# Patient Record
Sex: Female | Born: 1970 | Race: White | Hispanic: No | Marital: Married | Smoking: Never smoker
Health system: Southern US, Community
[De-identification: ages and names within clinical notes are randomized; demographics above are authoritative.]

## PROBLEM LIST (undated history)

## (undated) DIAGNOSIS — O021 Missed abortion: Secondary | ICD-10-CM

## (undated) DIAGNOSIS — F419 Anxiety disorder, unspecified: Secondary | ICD-10-CM

## (undated) HISTORY — PX: OTHER SURGICAL HISTORY: SHX169

## (undated) HISTORY — DX: Anxiety disorder, unspecified: F41.9

## (undated) HISTORY — DX: Missed abortion: O02.1

## (undated) HISTORY — PX: DILATION AND CURETTAGE OF UTERUS: SHX78

## (undated) HISTORY — PX: PELVIC LAPAROSCOPY: SHX162

## (undated) HISTORY — PX: APPENDECTOMY: SHX54

## (undated) HISTORY — PX: TUBAL LIGATION: SHX77

## (undated) HISTORY — PX: ENDOMETRIAL ABLATION: SHX621

---

## 1999-01-31 ENCOUNTER — Other Ambulatory Visit: Admission: RE | Admit: 1999-01-31 | Discharge: 1999-01-31 | Payer: Self-pay | Admitting: Obstetrics and Gynecology

## 1999-04-28 ENCOUNTER — Other Ambulatory Visit: Admission: RE | Admit: 1999-04-28 | Discharge: 1999-04-28 | Payer: Self-pay | Admitting: Gynecology

## 1999-04-28 ENCOUNTER — Encounter (INDEPENDENT_AMBULATORY_CARE_PROVIDER_SITE_OTHER): Payer: Self-pay | Admitting: Specialist

## 1999-04-28 ENCOUNTER — Ambulatory Visit (HOSPITAL_COMMUNITY): Admission: RE | Admit: 1999-04-28 | Discharge: 1999-04-28 | Payer: Self-pay

## 1999-08-19 ENCOUNTER — Other Ambulatory Visit: Admission: RE | Admit: 1999-08-19 | Discharge: 1999-08-19 | Payer: Self-pay | Admitting: Gynecology

## 1999-09-17 ENCOUNTER — Ambulatory Visit (HOSPITAL_COMMUNITY): Admission: RE | Admit: 1999-09-17 | Discharge: 1999-09-17 | Payer: Self-pay | Admitting: Internal Medicine

## 2000-07-22 ENCOUNTER — Other Ambulatory Visit: Admission: RE | Admit: 2000-07-22 | Discharge: 2000-07-22 | Payer: Self-pay | Admitting: Gynecology

## 2001-10-28 ENCOUNTER — Other Ambulatory Visit: Admission: RE | Admit: 2001-10-28 | Discharge: 2001-10-28 | Payer: Self-pay | Admitting: Gynecology

## 2003-04-05 ENCOUNTER — Other Ambulatory Visit: Admission: RE | Admit: 2003-04-05 | Discharge: 2003-04-05 | Payer: Self-pay | Admitting: Gynecology

## 2004-06-03 ENCOUNTER — Other Ambulatory Visit: Admission: RE | Admit: 2004-06-03 | Discharge: 2004-06-03 | Payer: Self-pay | Admitting: Gynecology

## 2005-06-08 ENCOUNTER — Other Ambulatory Visit: Admission: RE | Admit: 2005-06-08 | Discharge: 2005-06-08 | Payer: Self-pay | Admitting: Gynecology

## 2006-06-25 ENCOUNTER — Other Ambulatory Visit: Admission: RE | Admit: 2006-06-25 | Discharge: 2006-06-25 | Payer: Self-pay | Admitting: Gynecology

## 2007-06-27 ENCOUNTER — Other Ambulatory Visit: Admission: RE | Admit: 2007-06-27 | Discharge: 2007-06-27 | Payer: Self-pay | Admitting: Gynecology

## 2007-08-17 ENCOUNTER — Encounter: Payer: Self-pay | Admitting: Gynecology

## 2007-08-17 ENCOUNTER — Ambulatory Visit (HOSPITAL_BASED_OUTPATIENT_CLINIC_OR_DEPARTMENT_OTHER): Admission: RE | Admit: 2007-08-17 | Discharge: 2007-08-17 | Payer: Self-pay | Admitting: Gynecology

## 2008-03-12 ENCOUNTER — Ambulatory Visit: Payer: Self-pay | Admitting: Gynecology

## 2008-06-29 ENCOUNTER — Other Ambulatory Visit: Admission: RE | Admit: 2008-06-29 | Discharge: 2008-06-29 | Payer: Self-pay | Admitting: Gynecology

## 2008-06-29 ENCOUNTER — Ambulatory Visit: Payer: Self-pay | Admitting: Gynecology

## 2008-06-29 ENCOUNTER — Encounter: Payer: Self-pay | Admitting: Gynecology

## 2008-10-25 ENCOUNTER — Ambulatory Visit: Payer: Self-pay | Admitting: Gynecology

## 2008-11-06 ENCOUNTER — Ambulatory Visit: Payer: Self-pay | Admitting: Gynecology

## 2008-11-09 ENCOUNTER — Ambulatory Visit: Payer: Self-pay | Admitting: Gynecology

## 2008-12-03 ENCOUNTER — Ambulatory Visit: Payer: Self-pay | Admitting: Gynecology

## 2008-12-03 ENCOUNTER — Encounter: Payer: Self-pay | Admitting: Gynecology

## 2008-12-03 ENCOUNTER — Ambulatory Visit (HOSPITAL_COMMUNITY): Admission: RE | Admit: 2008-12-03 | Discharge: 2008-12-04 | Payer: Self-pay | Admitting: Gynecology

## 2008-12-03 HISTORY — PX: ABDOMINAL HYSTERECTOMY: SHX81

## 2008-12-11 ENCOUNTER — Ambulatory Visit: Payer: Self-pay | Admitting: Gynecology

## 2008-12-31 ENCOUNTER — Ambulatory Visit: Payer: Self-pay | Admitting: Gynecology

## 2009-01-14 ENCOUNTER — Ambulatory Visit: Payer: Self-pay | Admitting: Gynecology

## 2009-07-22 ENCOUNTER — Ambulatory Visit: Payer: Self-pay | Admitting: Gynecology

## 2009-07-22 ENCOUNTER — Other Ambulatory Visit: Admission: RE | Admit: 2009-07-22 | Discharge: 2009-07-22 | Payer: Self-pay | Admitting: Gynecology

## 2009-10-10 ENCOUNTER — Emergency Department: Payer: Self-pay | Admitting: Emergency Medicine

## 2010-07-13 ENCOUNTER — Encounter: Payer: Self-pay | Admitting: Gynecology

## 2010-07-23 ENCOUNTER — Ambulatory Visit: Admit: 2010-07-23 | Payer: Self-pay | Admitting: Gynecology

## 2010-07-23 ENCOUNTER — Other Ambulatory Visit (HOSPITAL_COMMUNITY)
Admission: RE | Admit: 2010-07-23 | Discharge: 2010-07-23 | Disposition: A | Discharge status: Home / Self Care | Payer: 59 | Source: Ambulatory Visit | Attending: Gynecology | Admitting: Gynecology

## 2010-07-23 ENCOUNTER — Encounter: Payer: 59 | Admitting: Gynecology

## 2010-07-23 ENCOUNTER — Other Ambulatory Visit: Payer: Self-pay | Admitting: Gynecology

## 2010-07-23 DIAGNOSIS — Z01419 Encounter for gynecological examination (general) (routine) without abnormal findings: Secondary | ICD-10-CM

## 2010-07-23 DIAGNOSIS — Z124 Encounter for screening for malignant neoplasm of cervix: Secondary | ICD-10-CM | POA: Insufficient documentation

## 2010-07-31 ENCOUNTER — Ambulatory Visit (HOSPITAL_COMMUNITY)
Admission: RE | Admit: 2010-07-31 | Discharge: 2010-07-31 | Disposition: A | Discharge status: Home / Self Care | Payer: 59 | Source: Ambulatory Visit | Attending: Gynecology | Admitting: Gynecology

## 2010-07-31 ENCOUNTER — Other Ambulatory Visit: Payer: Self-pay | Admitting: Gynecology

## 2010-07-31 DIAGNOSIS — Z801 Family history of malignant neoplasm of trachea, bronchus and lung: Secondary | ICD-10-CM

## 2010-08-01 ENCOUNTER — Other Ambulatory Visit: Payer: 59

## 2010-08-01 DIAGNOSIS — O99814 Abnormal glucose complicating childbirth: Secondary | ICD-10-CM

## 2010-08-01 DIAGNOSIS — Z13220 Encounter for screening for lipoid disorders: Secondary | ICD-10-CM

## 2010-09-29 LAB — HCG, SERUM, QUALITATIVE: Preg, Serum: NEGATIVE

## 2010-09-29 LAB — URINALYSIS, ROUTINE W REFLEX MICROSCOPIC
Hgb urine dipstick: NEGATIVE
Ketones, ur: NEGATIVE mg/dL
Nitrite: NEGATIVE
pH: 5.5 (ref 5.0–8.0)

## 2010-09-29 LAB — CBC
HCT: 46.9 % — ABNORMAL HIGH (ref 36.0–46.0)
MCV: 94 fL (ref 78.0–100.0)
Platelets: 196 10*3/uL (ref 150–400)
WBC: 7.4 10*3/uL (ref 4.0–10.5)
WBC: 8.2 10*3/uL (ref 4.0–10.5)

## 2010-11-04 NOTE — Op Note (Signed)
NAMELAMONICA, TRUEBA           ACCOUNT NO.:  [id number]   MEDICAL RECORD NO.:  [id number]          PATIENT TYPE:  OIB   LOCATION:  9320                          FACILITY:  [facility]   PHYSICIAN:  Juan H. Lily Peer, M.D.DATE OF BIRTH:  February 28, 1971   DATE OF PROCEDURE:  12/03/2008  DATE OF DISCHARGE:  12/04/2008                               OPERATIVE REPORT   SURGEON:  Lars Mage H. Lily Peer, MD   FIRST ASSISTANT:  Timothy P. Fontaine, MD   INDICATIONS FOR OPERATION:  A 40 year old gravida 3, para 1, AB 2 with  chronic pelvic pain, menometrorrhagia, and stress urinary incontinence.   PREOPERATIVE DIAGNOSES:  1. Dysmenorrhea.  2. Menorrhagia.  3. Pelvic pain.  4. Stress urinary incontinence.   POSTOPERATIVE DIAGNOSIS:  1. Dysmenorrhea.  2. Menorrhagia.  3. Pelvic pain.  4. Stress urinary incontinence.   ANESTHESIA:  General endotracheal anesthesia.   PROCEDURES PERFORMED:  1. Total laparoscopic hysterectomy.  2. Suburethral sling (Solyx single-incision sling).  3. Cystoscopy.   FINDINGS:  Retroverted leiomyomatous uteri with normal-appearing  ovaries, evidence of previous tubal sterilization was present.  No other  abnormality in the pelvis was noted.   DESCRIPTION OF OPERATION:  After the patient was adequately counseled,  she was taken to the operating room where she underwent successful  general endotracheal anesthesia.  She had received a gram of Cefotan for  prophylaxis as well as PSA stockings for DVT prophylaxis.  After the  abdomen was prepped and draped in the usual sterile fashion, a Foley  catheter had been inserted and also the uterus had been sounded to  approximately 10 cm and a RUMI uterine manipulator was placed with the  appropriate tip size and the KOH colpotomizer attached and the uterine  tip was inflated to maintain traction and maneuverability during the  laparoscopic hysterectomy.  After this was then placed and the drapes  were in place, a small  vertical incision was made underneath the  umbilicus and with the OptiVu 10-mm trocar inserted to the peritoneal  cavity.  Once the peritoneum was entered, the pneumoperitoneum was  established approximately 3 L of carbon dioxide.  A 10-mm port was  placed in the right lower abdomen and 5-mm port in the left lower  abdomen under laparoscopic guidance.  Both ovaries were inspected,  appeared normal.  Tubes with evidence of previous tubal sterilization  procedure.  There was no endometriosis or adhesions.  Anterior and  posterior cul-de-sac were free.  There was some subserosal myomas that  were noted.  With the [facility] electrical surgical generator, the  patient's right utero-ovarian ligament was coapted and transected as was  the right fallopian tube and the right round ligament.  The broad and  cardinal ligaments were serially coapted and transected with the  Harmonic scalpel, and the anterior bladder flap was established.  Similar procedure was carried out on the contralateral side with the  colpotomizer placed in the tension portion of the uterus.  The back  blade of the Harmonic scalpel was used to incise the vaginal fornix  anteriorly and posteriorly, and both uterine arteries were coapted and  transected  and the uterus and cervix were pulled out of the vagina.  Extracorporeal closure of the vaginal cuff was accomplished with 0 PDS  on a CT1 needle.  The pelvic cavity was copiously irrigated with normal  saline solution.  The pneumoperitoneum was removed and the 5- and 10-mm  sheaths were removed as well as 10-mm port sheath and laparoscope.  The  subfascial incision of this umbilicus and the 10-mm port were closed  with a running stitch of 0 Vicryl suture and the skin was reapproximated  with Dermabond glue and the umbilicus and the lower abdominal incisions  were closed with a running stitch of 3-0 Rapide suture.  Marcaine 0.25%  was infiltrated in all 3 port site postoperative  analgesia.  Attention  was then placed to the second portion of the operation.  The legs were  placed in a high lithotomy position.  Foley catheter was still in place.  The [place] retractor was placed for exposure.  The urethra was  identified and approximately 1 cm from the urethra in the midline, an  Allis clamp was placed and another one directly underneath it about a  centimeter and half below it.  Lidocaine 1% with 1:100,000 epinephrine  was infiltrated into the submucosa at the midline and in the direction  at 45 degrees where the sling would be placed for a total of  approximately 7 mL.  Metzenbaum scissors were used to dissect underneath  the vaginal mucosa at a 45-degree angle until the pubic rami and  obturator were palpated.  The Solyx device was then inserted with the  tape attached.  The sling had been marked in the midportion and with the  index finger inside the vagina to peel the vaginal sulcus and providing  direction, pressure was then inserted with a surgeon's thumb into the  obturator fossa.  Two clips of the Solyx device released the mesh on one  side and similar procedure was carried out on the contralateral side,  but before the device was released, the patient received indigo carmine  and cystoscopy was performed.  Both ureters were identified and the  bladder mucosa appeared to be intact, and then finally 240 mL of saline  was infiltrated into the bladder and the Crede maneuver was performed by  placing pressure over the mons pubis to see if there was any leakage or  whether the sling needed to be adjusted and it did not, so the Foley  catheter was reinserted and the device was released from the  contralateral side.  The submucosa was reapproximated with a running  stitch of 2-0 Vicryl suture.  The vagina was then packed and the patient  was extubated and transferred to recovery room with stable vital signs.  Blood loss from procedure was 200 mL.  IV fluids 2100  mL of lactated  Ringer.  Urine output 500 mL.      Juan H. Lily Peer, M.D.  Electronically Signed     JHF/MEDQ  D:  12/04/2008  T:  12/04/2008  Job:  [id number]

## 2010-11-04 NOTE — H&P (Signed)
[name], [name]           ACCOUNT NO.:  [id number]   MEDICAL RECORD NO.:  [id number]          PATIENT TYPE:  AMB   LOCATION:  SDC                           FACILITY:  [facility]   PHYSICIAN:  Juan H. Lily Peer, M.D.DATE OF BIRTH:  06/21/1971   DATE OF ADMISSION:  DATE OF DISCHARGE:                              HISTORY & PHYSICAL   The patient is scheduled for surgery, Monday June 14 at 1:00 p.m. at  [facility].  Please have history and physical available.   CHIEF COMPLAINT:  1. Menometrorrhagia.  2. Pelvic pain.  3. Stress urinary incontinence.   HISTORY:  The patient is a 40 year old, gravida 3, para 1, and AB 2, who  is scheduled to undergo a total laparoscopic-assisted vaginal  hysterectomy with suburethral sling (Solex single sling incision) as a  result of the patient's menometrorrhagia and stress urinary  incontinence.  The patient was seen for preoperative consultation on May  21 and to evaluate her recent urodynamic evaluation due to the fact that  she had complained of stress incontinence with coughing and sneezing.  The patient has had history of endometrial ablation in the past as well  as 2 laparoscopies, one for pelvic pain and the other one for  laparoscopic tubal ligation.  Her urodynamic evaluation had been  performed on May 18.  Complex uroflowmetry was normal and demonstrated  no evidence of obstructive uropathy.  Her post residual volume was 30 mL  which was normal.  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  The patient's  cystometric bladder capacity was limited.  The patient's sensory volumes  were lower than expected.  Maximum urethral closure pressures were  within normal range and her III channel CMG with flow was within normal  limits given the testing  situation, so based on the patient's urodynamic  findings contributing factor to the stress urinary incontinence  attributing to a low leak point pressure with little hypermobility of  the urethra and minimal cystocele, and will benefit from a suburethral  sling procedure at the time of a total laparoscopic hysterectomy.  Of  note, her Q-tip test was less than 30 degrees when it was done in the  office.  Also as part of her evaluation for menometrorrhagia and pelvic  pain, the patient had an endometrial biopsy on May of this year and a  weak proliferative endometrium was noted.  She also had an ultrasound on  May 6 at the same time which demonstrated she had 2 thin wall echo-free  cyst 26 x 25 mm and 12 x 10 cm with diffuse internal low-level echoes  and a complex thick wall cyst measuring 19 x 18 mm negative color-flow.  The left ovary had several follicles and there was some blood in the cul-  de-sac consistent with a ruptured cyst.   The patient's past medical history, she is allergic to AMPICILLIN.  She  has had 1 normal spontaneous vaginal delivery, 2 spontaneous ABs.  She  has suffered in the past for anxiety.   She has been taking calcium with vitamin D daily and Xanax 0.5 mg p.r.n.   Her surgeries have consisted of 2 D and Cs, 1 laparoscopy, appendectomy,  laparoscopic tubal sterilization procedure, and endometrial ablation.   Family history is significant for mother and grandfather with diabetes,  and an aunt with breast cancer.   On physical examination,   DICTATION ENDED AT THIS POINT.      Juan H. Lily Peer, M.D.  Electronically Signed     JHF/MEDQ  D:  11/30/2008  T:  12/01/2008  Job:  [id number]

## 2010-11-04 NOTE — H&P (Signed)
[name], [name]           ACCOUNT NO.:  [id number]   MEDICAL RECORD NO.:  [id number]          PATIENT TYPE:  AMB   LOCATION:  SDC                           [facility]ACILITY:  [facility]   PHYSICIAN:  Juan H. Lily Peer, M.D.DATE OF BIRTH:  [birth year]   DATE OF ADMISSION:  DATE OF DISCHARGE:                              HISTORY & PHYSICAL   CHIEF COMPLAINT:  1. Pelvic pain.  2. Menometrorrhagia.  3. Stress urinary incontinence.   HISTORY:  The patient is a 40 year old gravida 3, para 1, AB 2, who is  scheduled to undergo a total laparoscopic hysterectomy along with  suburethral sling procedure on Monday, December 03, 2008, at 1 p.m.  secondary to menorrhagia, pelvic pain, and stress urinary incontinence.  Her workup has consisted of a benign endometrial biopsy demonstrating a  weakly proliferative endometrium.  She has had an ultrasound which  demonstrated uterus measured 8.2 x 6.2 x 5.2 cm retroverted.  She had 2  thin-walled echo-free cysts, 26 mm x 25 mm, and a second one 12 x 10 mm,  and on the left, had a small complex cyst measured 19 x 80 mm, negative  color flow, and some fluid in the cul-de-sac consistent with a possible  ruptured cyst.  The patient's most recent Pap smear was in January this  year which was normal as well.  Because of her stress incontinence when  she coughs and sneeze, she was evaluated, her Q-tip test was less than  30 degrees.  She has a very mild cystocele.  Urodynamic studies done on  Nov 06, 2008, demonstrated complex uroflow with normal and demonstrated  no evidence of obstructive uropathy.  Her postop residuals 30 mL.  Valsalva leak point pressure consistent with genuine stress incontinence  due intrinsic sphincter deficiency.  The patient cystometric bladder  capacity was limited less than 400 mL.  Sensory volumes were more than  expected, maximum urethral closure pressure was greater than 30-cm  within normal range.  Her 3-channel CMG with flow was within  normal  limits given the testing situation.  Based on this findings, the patient  would benefit from a suburethral sling and we are planning on putting  Solyx single sling.   PAST MEDICAL HISTORY:  She has had 2 D&Cs in the past.   ALLERGIES:  Allergic to AMPICILLIN.   OB HISTORY:  She has had 1 normal spontaneous vaginal delivery, 2  spontaneous AB.  She has suffered from anxiety in the past.   She takes calcium, vitamin D daily, Xanax 0.5 mg p.r.n.   PAST SURGICAL HISTORY:  Surgeries have consisted of 2  D and Cs, 1  laparoscopy, 1 appendectomy, laparoscopic tubal and sterilization  procedure and an endometrial ablation as well.   FAMILY HISTORY:  Mother and grandfather with diabetes and with history  of breast cancer.   PHYSICAL EXAMINATION:  VITAL SIGNS:  The patient weighs 123 pounds.  She  is 5 feet tall.  Blood pressure 122/78.  HEENT:  Unremarkable.  NECK:  Supple.  Trachea midline.  No carotid bruits.  No thyromegaly.  LUNGS:  Clear to auscultation without any  rhonchi or wheezes.  HEART:  Regular rate and rhythm.  No murmurs or gallops.  ABDOMEN:  Soft and nontender.  PELVIC:  Bartholin, urethra, Skene within normal limits as described  above.  A very minimal of any first-degree cystocele, Q-tip angle  changed, and Valsalva was less than 30 degrees.  Uterus, anteverted,  normal size, shape, consistency.  Adnexa without any palpable mass or  tenderness.   ASSESSMENT:  1. Pelvic pain, rule out endometriosis.  2. Menometrorrhagia.  3. Stress urinary incontinence.   The patient was counseled as to the total laparoscopic hysterectomy and  suburethral sling procedure.  The risks discussed with the patient at  length are as follows, the risk of infection although she will receive  prophylaxis antibiotic, and are fully aware that she is allergic to  AMPICILLIN.  The risk of deep venous thrombosis and subsequent pulmonary  embolism were discussed.  She will have PSA  stockings for prevention,  also the event of any technical difficulty or internal hemorrhage or  unable to complete the operation laparoscopically, an open abdominal  incision may need to be performed and the patient would need to be  hospitalized.  Also, the risk for additional days, also the risk of  trauma to internal organs such as bladder and  intestines were  discussed.  If she were to need blood or blood products, she is fully  aware of potential risk of anaphylactic reaction, hepatitis, and AIDS.  From the standpoint of the sling procedure for stress urinary  incontinence, we discussed the following risk of injury to the bladder,  her nerves, pain, discomfort in her legs, dyspareunia, erosion, and  urinary retention.  We will give the patient's instructional self-  catheterization while she is in the hospital in the event that she may  have any difficulty voiding.  All these issues were discussed with the  patient in detail and literature information was provided.  The patient  fully agrees and accepts all the above risks and will follow  accordingly.   PLAN:  The patient is scheduled for total laparoscopic hysterectomy with  suburethral sling procedure on Monday, December 03, 2008, 1 p.m. at  [facility].  Please have history and physical available.      Juan H. Lily Peer, M.D.  Electronically Signed     JHF/MEDQ  D:  11/30/2008  T:  12/01/2008  Job:  [id number]

## 2010-11-04 NOTE — H&P (Signed)
[name], [name]           ACCOUNT NO.:  [id number]   MEDICAL RECORD NO.:  [id number]          PATIENT TYPE:  AMB   LOCATION:  [facility]                         FACILITY:  [facility]   PHYSICIAN:  Juan H. Lily Peer, M.D.DATE OF BIRTH:  05/03/1971   DATE OF ADMISSION:  DATE OF DISCHARGE:                              HISTORY & PHYSICAL   The patient is scheduled for surgery, Wednesday, February 25 at 8:30  a.m. at [facility].  Please have the history and  physical available.   CHIEF COMPLAINT:  1. Elective permanent sterilization.  2. Ovarian cyst.  3. Menorrhagia.   HISTORY:  The patient is a 40 year old gravida 3, para 1, AB1, who was  seen in the office for annual gynecological examination on January 5.  She had been complaining of heavy periods every 20 days.  She stated she  was no longer concerned about getting pregnant in the future.  She  already has one child.  Her husband has multiple medical problems.  The  patient had had an endometrial biopsy on January 12, which demonstrated  benign disorder proliferative endometrium.  She could not tolerate the  IUD in the past and had to be removed after 4 years.  She could not  tolerate the oral contraceptive pills either.  She wanted to proceed  with a laparoscopic tubal ligation and we discussed also concurrently  doing an endometrial ablation.  The ultrasound on January 12,  demonstrated a thin-walled echo free avascular cyst measuring 38 x 31 x  25 mm and a left complex follicle of thin wall at 27 x 23 x 22 mm  layering of debris with a thin septation consistent with corpus luteum  cyst and some positive color flow Doppler to the perimeter of the cyst.  A sonohystogram had been negative.  Today, February 24, as part of a  preoperative consultation, she had a followup ultrasound which  demonstrated a month later that 2 thin-walled, echo-free follicles were  noted on the right and a thin wall complex cyst with  internal low-level  echoes measuring 18 x 17 mm.  Some fluid surrounding the ovary were  noted.  Negative color flow Doppler.  Previous large cyst not seen.  Left ovary, 2 complex follicles, negative color flow Doppler.  The  patient was placed on Prometrium 300 __________ mg one orally daily for  2 weeks prior to this procedure and today she had laminaria placed  intracervically as part of preoperative evaluation and examination.   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  Her only  medication is the Prometrium 200 mg which she has been taking for the  past 2 weeks and she takes calcium one tablet orally daily.  She suffers  at times from anxiety.  She has also has had laparoscopy done in 2001  for dysmenorrhea, dyspareunia, and negative findings at that time.  She  has also had an appendectomy before.   FAMILY HISTORY:  Mother and grandfather with history of diabetes and  aunt with history of breast cancer.  PHYSICAL EXAMINATION:  The patient weighs 117 pounds, 5 feet 1-1/4  inches tall, and blood pressure 108/72.  HEENT:  Unremarkable.  NECK:  Supple.  Trachea midline.  No carotid bruits.  No thyromegaly.  LUNGS:  Clear to auscultation without rhonchi or wheezing.  HEART:  Regular rate and rhythm.  No murmurs or gallops.  BREASTS:  Exam not done.  ABDOMEN:  Soft and nontender without rebound or guarding.  PELVIC:  Bartholin, urethra, and Skene are within normal limits.  Vagina  and cervix, no lesions or discharge.  Uterus is slightly retroverted.  Adnexa:  No palpable masses or tenderness.  RECTAL:  Exam deferred.   ASSESSMENT:  A 40 year old gravida 3, para 1, AB1, with menorrhagia and  pelvic pains at times and history of ovarian cyst and requesting  elective sterilization.  The patient with prior negative sonohystogram  and benign endometrial biopsy had laminaria placed intracervically   today.  The patient will undergo diagnostic laparoscopy with possible  ovarian cystectomy as well as bilateral tubal sterilization procedure  followed by rollerball endometrial ablation.  The risks and benefits and  pros and cons of both procedures were discussed with the patient.  The  patient is fully aware that she will not be able to have any more  children.  She fully accepts.  All questions were answered and will  follow accordingly.   PLAN:  As per assessment above.      Juan H. Lily Peer, M.D.  Electronically Signed     JHF/MEDQ  D:  08/16/2007  T:  08/17/2007  Job:  [id number]

## 2010-11-04 NOTE — Op Note (Signed)
Kayla, Mcdaniel           ACCOUNT NO.:  [id number]   MEDICAL RECORD NO.:  [id number]          PATIENT TYPE:  AMB   LOCATION:  [facility]                         FACILITY:  [facility]   PHYSICIAN:  Juan H. Lily Peer, M.D.DATE OF BIRTH:  Dec 13, 1970   DATE OF PROCEDURE:  08/17/2007  DATE OF DISCHARGE:                               OPERATIVE REPORT   INDICATIONS FOR OPERATION:  A 40 year old gravida 3, para 1, AB 2 with  menorrhagia, history of ovarian cyst, requiring an elective permanent  sterilization.   PREOPERATIVE DIAGNOSES:  1. Elective permanent sterilization.  2. Right ovarian cyst.  3. Menorrhagia.   POSTOPERATIVE DIAGNOSES:  1. Elective permanent sterilization.  2. Right ovarian cyst.  3. Menorrhagia.   ANESTHESIA:  General endotracheal anesthesia.   PROCEDURES PERFORMED:  1. Diagnostic laparoscopy.  2. Bilateral tubal ligation (cauterization with transection).  3. Aspiration of right ovarian cyst.  4. Endometrial ablation, roller bar technique.   FINDINGS:  The laparoscopic portion demonstrated normal-appearing  uterus, normal fallopian tube bilaterally, normal left ovary, right  ovary has small 2 x 3 cm simple cyst.  No other abnormalities were noted  in the pelvis or cul-de-sac.   DESCRIPTION OF OPERATION:  After the patient was adequately counseled,  she was taken to the operating room where she underwent a successful  general endotracheal anesthesia.  She was placed on low lithotomy  position.  The abdomen, vagina, and perineum were prepped and draped in  the usual sterile fashion.  A laminaria that was placed  the night  before was removed from the cervix and a Hulka tenaculum was placed  after bimanual examination demonstrated a slightly retroverted uterus.  After the drapes were in place and Foley catheter was in place, a small  incision was made under the umbilicus with a 10/11 mm trocar Optiview  was utilized into the peritoneal cavity.  The peritoneal  cavity was  insufflated to approximately 3 liters of carbon dioxide.  Two additional  ports remained under laparoscopic guidance.  Two fingerbreadths above  the symphysis pubis at the midline and the other one, right lateral  lower abdomen where two 5 mm trocars were inserted under the  laparoscopic evaluation, done under laparoscopic views.  Thorough  inspection of the pelvis demonstrated normal anterior and posterior cul-  de-sac.  No pelvic adhesions.  No endometriosis.  The left and right  fallopian tubes were normal.  The left ovary was normal.  The right  ovary had a small 2.5 cm clear cyst which was aspirated and the fluids  sent for cytology.  Pelvic washings was also performed.  After this was  completed, the right fallopian tube was placed under tension and with a  bipolar Kleppinger forceps, a 2-cm segment of fallopian tube was  cauterized and a small area was transected.  Similar procedure was  carried out on the other side.  Pictures were taken as well.  The  pneumoperineum was removed after ascertaining  adequate hemostasis.  The  subumbilical fascia was closed with a single figure-of-eight 0-Vicryl  and the subcutaneous tissue was reapproximated with 3-0 Vicryl suture  and Dermabond  glue.  The two 5 mm ports were reapproximated with  Dermabond glue.  Marcaine 0.25% was infiltrated in all 3 incisions sites  for a total of 10 cc for postoperative analgesia.   Attention was placed into the hysteroscopic portion of the procedure.  The legs were then placed in a high lithotomy position and the Hulka  tenaculum was removed.  A single-tooth tenaculum was placed on the  anterior cervical lip.  The Lendell Caprice operative resectoscope with the  roller bar attachment was introduced into the intrauterine cavity.  Sorbitol 3% was to sustain media, and VaporTrode was utilized for  ablation of the endometrium.  My thorough inspection of the uterus  demonstrated normal intrauterine cavity.   The left fallopian tube and  right fallopian tube were identified and no endocervical abnormality was  noted.  The Valleylab electrosurgical generator was set at cutting mode  of 160 and coagulation on 70 in a circumferential fashion starting from  the fundus moving towards the internal cervical os.  The VaporTrode was  utilized to ablate the entire endometrial cavity.  Pre and post pictures  were obtained.  Fluid deficit from the sorbital was 5 cc.  For  postoperative tamponade of the uterus is  there were  small vessels, a  30 cc Foley was placed into the uterus for tamponade which will be  removed on discharge from the outpatient recovery room area.  The  patient did receive 1 g of cefoxitin  preoperatively.  IV fluids 1100 cc  of lactated Ringer's and urine output had been 100 cc and clear.      Juan H. Lily Peer, M.D.  Electronically Signed     JHF/MEDQ  D:  08/17/2007  T:  08/18/2007  Job:  [id number]

## 2010-11-07 NOTE — Op Note (Signed)
[facility]  PatientKANIJAH Mcdaniel                     MRN: [id number] Proc. Date: 04/28/99 Adm. Date:  [id number] Attending:  Tonye Royalty                           Operative Report  PREOPERATIVE DIAGNOSIS:       First trimester inevitable abortion.  POSTOPERATIVE DIAGNOSIS:      First trimester inevitable abortion.  OPERATION:                    Dilatation and evacuation.  SURGEON:                      Juan H. Lily Peer, M.D.  ASSISTANT:  ANESTHESIA:                   MAC - paracervical block with 2% Xylocaine with 1:100,000 epinephrine.  ESTIMATED BLOOD LOSS:  INDICATIONS:                  A 40 year old, gravida 3, para 1, abortus 1, currently six weeks into her pregnancy who has had bleeding on and off for the ast several days.  Ultrasound in the office today demonstrated an empty gestational sac and flattened and on examination there was tissue extruding from the cervical os.  DESCRIPTION OF PROCEDURE:     After the patient was adequately counseled she was taken to the operating room where she underwent a MAC anesthesia without any complications.  The patient did receive 1 gram of Cefotan prophylactically before commencement of the operation.  The uterus was felt to be slightly retroverted approximately 6 to 8 weeks size, no adnexal masses or tenderness.  A red rubber  Roxan Hockey was inserted to evacuate the bladder contents for approximately 50 cc. A Graves speculum was inserted into the vaginal vault.  2% Xylocaine with 1:100,000 epinephrine was infiltrated into the cervical stroma for approximately 10 cc at the 2, 4, 8, and 10 oclock.  The cervix required minimal dilatation and an 8 mm curet was introduced into the intrauterine cavity whereby a minimal amount of products of conception were extruded.  This was interchanged with a blunt curet and additional fragments of retained products of conception were  removed, but again it was small quantities.  It appears that the patient may have passed most of it in the office which was submitted in a separate alloquot and this was noted in the report to he pathologist on this specimen to make him aware that an alloquot was previously submitted from the office.  The patient was given 10 units of Pitocin and 500 cc of Ringers lactate.  The single tooth tenaculum and Graves speculum were removed.  The patient tolerated the procedure well.  Her blood type is O positive.  Total  fluid resuscitation consisted of 800 cc of Ringers lactate.  She was transferred to the recovery room with stable vital signs. DD:  04/28/99 TD:  04/28/99 Job: [id number]

## 2010-11-07 NOTE — Op Note (Signed)
[facility]  Patient:    [name], [name]                  MRN: [id number] Adm. Date:  [id number] Attending:  Tonye Royalty                           Operative Report  SURGEON:                      Gaetano Hawthorne. Lily Peer, M.D.  INDICATION FOR OPERATION:     40 year old gravida 3, para 1, AB 2 with chronic pelvic pain and dyspareunia and postcoital bleeding.  PREOPERATIVE DIAGNOSES:       1. Dysmenorrhea.                               2. Dyspareunia and postcoital bleeding.  POSTOPERATIVE DIAGNOSES:      Same.  ANESTHESIA:                   General endotracheal anesthesia.  PROCEDURE PERFORMED:          Diagnostic laparoscopy.  DESCRIPTION OF OPERATION:     After the patient was adequately counseled she was taken to the operating room where she was placed in the low lithotomy position after general endotracheal anesthesia was on board.  The abdomen and vagina and  perineum were prepped and draped in the usual sterile fashion.  A Red Rubber Roxan Hockey was inserted into the bladder to evacuate it of its contents. Examination under anesthesia demonstrated retroverted uterus.  No palpable adnexal masses.  Hulka tenaculum was placed for manipulation during laparoscopic procedure. After the drapes were in place a small stab incision was made underneath the umbilicus followed by insertion of the Verres needle.  Opening internal abdominal pressure was approximately 3 mmHg.  Approximately 3 L of carbon dioxide was insufflated into the abdominal cavity.  After this the Veress needle was removed and followed by insertion of a 10/11 mm Trocar.  A second puncture site was made 2 cm above the symphysis pubis with a 5 mm Trocar under laparoscopic guidance and in a systemic fashion and the anterior/posterior cul-de-sac were inspected. There was no evidence of endometriosis or pelvic adhesions.  Both tubes and ovary appeared normal in appearance.   The under surface of both ovaries appeared normal as well.  There was no evidence of appendix due to the fact that patient has had an appendectomy in the past.  The liver surface appeared smooth.  Gallbladder was ot seen.  No other gross abnormality was noted.  Approximately 10 cc of peritoneal  fluid was found in the cul-de-sac and was aspirated.  Otherwise, a negative diagnostic laparoscopy.  After this the instruments were removed.  Sponge count and needle count were correct.  The 10/11 mm Trocar site fascia was closed with a running stitch of 3-0 Vicryl suture and the skin was reapproximated with subcuticular stitch of 4-0 plain cat gut suture.  The 5 mm Trocar site skin was  reapproximated with interrupted sutures of 4-0 plain cat gut suture.  For postoperative analgesia .25% Marcaine was infiltrated in both incision sites for a total of 10 cc.  The Hulka tenaculum was removed.  Patient received 30 mg of Toradol IV and was transferred to recovery room with stable vital signs.  Blood  loss was minimal.  Fluid  resuscitation consisted of 1000 cc of lactated Ringers. DD:  09/17/99 TD:  09/17/99 Job: [id number]

## 2011-03-13 LAB — POCT PREGNANCY, URINE
Operator id: 114531
Preg Test, Ur: NEGATIVE

## 2011-05-25 ENCOUNTER — Telehealth: Payer: Self-pay

## 2011-05-25 DIAGNOSIS — F419 Anxiety disorder, unspecified: Secondary | ICD-10-CM

## 2011-05-25 MED ORDER — ALPRAZOLAM 0.25 MG PO TABS: 0.2500 mg | ORAL_TABLET | Freq: Every evening | ORAL | Status: AC | PRN

## 2011-05-25 NOTE — Telephone Encounter (Signed)
PT. C-O INCREASED STRESS & THE STRESS IS CAUSING HEADACHES. YOU HAVE GIVEN HER XANAX IN THE PAST FOR THIS AND SHE IS REQUESTING RX NOW.

## 2011-05-25 NOTE — Telephone Encounter (Signed)
Per JF he said [facility] for the prescription and asked for it to be called in since couldn't be escribed. Rx called in.

## 2011-07-03 ENCOUNTER — Ambulatory Visit (INDEPENDENT_AMBULATORY_CARE_PROVIDER_SITE_OTHER): Payer: 59 | Admitting: Gynecology

## 2011-07-03 ENCOUNTER — Encounter: Payer: Self-pay | Admitting: Gynecology

## 2011-07-03 VITALS — BP 118/70

## 2011-07-03 DIAGNOSIS — Z1211 Encounter for screening for malignant neoplasm of colon: Secondary | ICD-10-CM

## 2011-07-03 DIAGNOSIS — F329 Major depressive disorder, single episode, unspecified: Secondary | ICD-10-CM

## 2011-07-03 DIAGNOSIS — F411 Generalized anxiety disorder: Secondary | ICD-10-CM

## 2011-07-03 DIAGNOSIS — K921 Melena: Secondary | ICD-10-CM

## 2011-07-03 DIAGNOSIS — F419 Anxiety disorder, unspecified: Secondary | ICD-10-CM | POA: Insufficient documentation

## 2011-07-03 DIAGNOSIS — F3289 Other specified depressive episodes: Secondary | ICD-10-CM

## 2011-07-03 MED ORDER — PAROXETINE HCL ER 12.5 MG PO TB24: 12.5000 mg | ORAL_TABLET | ORAL | Status: DC

## 2011-07-03 NOTE — Progress Notes (Signed)
Patient presented to the office today with several complaints one is that for the past month and a half she's been noticing blood per rectum with her she has a bowel movement her not to sometime she describes it as much as equivalent to a menstrual period although she has a history of a total laparoscopic hysterectomy and sling procedure in the past. She denies constipation. No family history of GI disorders reported. She also suffered in time from headaches due to a lot of tension stress. She's going through a difficult time in her family right now her husband is unemployed applying for disability is in the process and back surgery she is the only breadwinner. She suffers at times from insomnia but no change in appetite reported. She feels anxious we'll be able to cope at times becomes teary-eyed and depressed has very little family support with exceptions of her husband and children. 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  Exam: Vital signs stable afebrile HEENT unremarkable Lungs clear to auscultation Heart regular rate and rhythm no murmurs or gallops Abdomen soft nontender no rebound Pelvic: Bartholin urethra Skene was within normal limits Vagina: Vaginal cuff intact no gross lesions on inspection Cervix: Absent prior hysterectomy Rectal: The patient was placed in knee-chest position and endoscopic exam was performed no evidence of internal/external hemorrhoids or fissures noted. Fecal occult blood testing was was done.  Assessment: #1 depression/anxiety #2 hematochezia to be evaluated by gastroenterologist #3 tension headaches  Plan patient will be placed on Paxil CR 25 mg daily risk benefits and pros and cons were discussed. She will discontinue the Xanax. She will take over-the-counter migraine headache medications as needed. We'll make arrangements for her to see the gastroenterologist in consultation for further evaluation. She will be placed on antidepressant medication for 6  months if her symptoms worsen she will contact the office so we can refer her to a therapist. There was no evidence of suicidal deviation today.

## 2011-07-03 NOTE — Patient Instructions (Signed)
Depression  Depression is a strong emotion of feeling unhappy that can last for weeks, months, or even longer. Depression causes problems with the ability to function in life. It upsets your:   Relationships.   Sleep.   Eating habits.   Work habits.  HOME CARE  Take all medicine as told by your doctor.   Talk with a therapist, counselor, or friend.   Eat a healthy diet.   Exercise regularly.   Do not drink alcohol or use drugs.  GET HELP RIGHT AWAY IF: You start to have thoughts about hurting yourself or others. MAKE SURE YOU:  Understand these instructions.   Will watch your condition.   Will get help right away if you are not doing well or get worse.   Anxiety and Panic Attacks Your caregiver has informed you that you are having an anxiety or panic attack. There may be many forms of this. Most of the time these attacks come suddenly and without warning. They come at any time of day, including periods of sleep, and at any time of life. They may be strong and unexplained. Although panic attacks are very scary, they are physically harmless. Sometimes the cause of your anxiety is not known. Anxiety is a protective mechanism of the body in its fight or flight mechanism. Most of these perceived danger situations are actually nonphysical situations (such as anxiety over losing a job). CAUSES  The causes of an anxiety or panic attack are many. Panic attacks may occur in otherwise healthy people given a certain set of circumstances. There may be a genetic cause for panic attacks. Some medications may also have anxiety as a side effect. SYMPTOMS  Some of the most common feelings are:  Intense terror.   Dizziness, feeling faint.   Hot and cold flashes.   Fear of going crazy.   Feelings that nothing is real.   Sweating.   Shaking.   Chest pain or a fast heartbeat (palpitations).   Smothering, choking sensations.   Feelings of impending doom and that death is near.    Tingling of extremities, this may be from over-breathing.   Altered reality (derealization).   Being detached from yourself (depersonalization).  Several symptoms can be present to make up anxiety or panic attacks. DIAGNOSIS  The evaluation by your caregiver will depend on the type of symptoms you are experiencing. The diagnosis of anxiety or panic attack is made when no physical illness can be determined to be a cause of the symptoms. TREATMENT  Treatment to prevent anxiety and panic attacks may include:  Avoidance of circumstances that cause anxiety.   Reassurance and relaxation.   Regular exercise.   Relaxation therapies, such as yoga.   Psychotherapy with a psychiatrist or therapist.   Avoidance of caffeine, alcohol and illegal drugs.   Prescribed medication.  SEEK IMMEDIATE MEDICAL CARE IF:   You experience panic attack symptoms that are different than your usual symptoms.   You have any worsening or concerning symptoms.  Document Released: 06/08/2005 Document Revised: 02/18/2011 Document Reviewed: 10/10/2009 [facility] Patient Information 2012 [place], [place]. Document Released: 07/11/2010 Document Revised: 02/18/2011 Document Reviewed: 07/11/2010 [facility] Patient Information 2012 [place], [place].

## 2011-07-03 NOTE — Progress Notes (Signed)
Addended by: Ok Edwards on: 07/03/2011 06:06 PM   Modules accepted: Orders

## 2011-07-06 ENCOUNTER — Telehealth: Payer: Self-pay | Admitting: *Deleted

## 2011-07-06 DIAGNOSIS — F419 Anxiety disorder, unspecified: Secondary | ICD-10-CM

## 2011-07-06 DIAGNOSIS — F329 Major depressive disorder, single episode, unspecified: Secondary | ICD-10-CM

## 2011-07-06 NOTE — Telephone Encounter (Signed)
Paxil CR 12.51 by mouth daily she can be given IV tablets at a time with 4 refills.

## 2011-07-06 NOTE — Telephone Encounter (Signed)
Paxil CR 12.5 rx was sent in quanity #1 and 5 refills.  Pharmacy wants to know if they can have a 90 day rx on this?

## 2011-07-06 NOTE — Progress Notes (Signed)
Addended by: Ladona Horns E on: 07/06/2011 07:17 AM   Modules accepted: Orders

## 2011-07-07 MED ORDER — PAROXETINE HCL ER 12.5 MG PO TB24: 12.5000 mg | ORAL_TABLET | ORAL | Status: DC

## 2011-07-07 NOTE — Telephone Encounter (Signed)
rx sent

## 2011-09-17 ENCOUNTER — Emergency Department (HOSPITAL_COMMUNITY): Payer: 59

## 2011-09-17 ENCOUNTER — Emergency Department (HOSPITAL_COMMUNITY)
Admission: EM | Admit: 2011-09-17 | Discharge: 2011-09-17 | Disposition: A | Discharge status: Home / Self Care | Payer: 59 | Attending: Emergency Medicine | Admitting: Emergency Medicine

## 2011-09-17 ENCOUNTER — Other Ambulatory Visit: Payer: Self-pay

## 2011-09-17 ENCOUNTER — Encounter (HOSPITAL_COMMUNITY): Payer: Self-pay

## 2011-09-17 DIAGNOSIS — R51 Headache: Secondary | ICD-10-CM | POA: Insufficient documentation

## 2011-09-17 DIAGNOSIS — R0602 Shortness of breath: Secondary | ICD-10-CM | POA: Insufficient documentation

## 2011-09-17 DIAGNOSIS — Z9889 Other specified postprocedural states: Secondary | ICD-10-CM | POA: Insufficient documentation

## 2011-09-17 DIAGNOSIS — Z7982 Long term (current) use of aspirin: Secondary | ICD-10-CM | POA: Insufficient documentation

## 2011-09-17 DIAGNOSIS — R209 Unspecified disturbances of skin sensation: Secondary | ICD-10-CM | POA: Insufficient documentation

## 2011-09-17 DIAGNOSIS — F410 Panic disorder [episodic paroxysmal anxiety] without agoraphobia: Secondary | ICD-10-CM | POA: Insufficient documentation

## 2011-09-17 DIAGNOSIS — R112 Nausea with vomiting, unspecified: Secondary | ICD-10-CM | POA: Insufficient documentation

## 2011-09-17 DIAGNOSIS — F411 Generalized anxiety disorder: Secondary | ICD-10-CM | POA: Insufficient documentation

## 2011-09-17 DIAGNOSIS — R079 Chest pain, unspecified: Secondary | ICD-10-CM | POA: Insufficient documentation

## 2011-09-17 LAB — CARDIAC PANEL(CRET KIN+CKTOT+MB+TROPI)
CK, MB: 1.6 ng/mL (ref 0.3–4.0)
Total CK: 64 U/L (ref 7–177)

## 2011-09-17 LAB — DIFFERENTIAL
Basophils Absolute: 0 10*3/uL (ref 0.0–0.1)
Eosinophils Absolute: 0.1 10*3/uL (ref 0.0–0.7)
Eosinophils Relative: 2 % (ref 0–5)
Lymphocytes Relative: 13 % (ref 12–46)
Neutrophils Relative %: 81 % — ABNORMAL HIGH (ref 43–77)

## 2011-09-17 LAB — CBC
MCV: 90.9 fL (ref 78.0–100.0)
Platelets: 245 10*3/uL (ref 150–400)
RDW: 12.7 % (ref 11.5–15.5)
WBC: 8 10*3/uL (ref 4.0–10.5)

## 2011-09-17 LAB — BASIC METABOLIC PANEL
Calcium: 9.5 mg/dL (ref 8.4–10.5)
GFR calc non Af Amer: >90 mL/min (ref >90)
Sodium: 138 mEq/L (ref 135–145)

## 2011-09-17 MED ORDER — HYDROCODONE-ACETAMINOPHEN 5-325 MG PO TABS: 1.0000 | ORAL_TABLET | ORAL | Status: AC | PRN

## 2011-09-17 MED ORDER — HYDROCODONE-ACETAMINOPHEN 5-325 MG PO TABS
1.0000 | ORAL_TABLET | Freq: Once | ORAL | Status: AC
  Administered 2011-09-17: 1 via ORAL
  Filled 2011-09-17: qty 1

## 2011-09-17 MED ORDER — LORAZEPAM 1 MG PO TABS: 1.0000 mg | ORAL_TABLET | Freq: Three times a day (TID) | ORAL | Status: AC | PRN

## 2011-09-17 MED ORDER — LORAZEPAM 1 MG PO TABS
1.0000 mg | ORAL_TABLET | Freq: Once | ORAL | Status: AC
  Administered 2011-09-17: 1 mg via ORAL
  Filled 2011-09-17: qty 1

## 2011-09-17 NOTE — Discharge Instructions (Signed)
RESOURCE GUIDE  Dental Problems  Patients with Medicaid: [facility]                     [facility] [address]                                           [address] Phone:  [phone]                                                  Phone:  [phone]  If unable to pay or uninsured, contact:  [facility] or [facility] to become qualified for the adult dental clinic.  Chronic Pain Problems Contact [facility]  [phone] Patients need to be referred by their primary care doctor.  Insufficient Money for Medicine Contact [employer]:  call "211" or [facility] 271-5999.  No Primary Care Doctor Call [facility]  832-8000 Other agencies that provide inexpensive medical care    [facility]  832-8035    [facility]  832-7272    [facility]  [phone]    Women's Clinic  832-4777    [facility]  373-0678    [facility]  272-1050  Psychological Services [facility]  832-9600 [facility]  [phone] [facility]   800 853-5163 (emergency services 641-4993)  Substance Abuse Resources [facility]  336-882-2125 [facility] 336-784-9470 [facility] 336-285-9073 [facility] 336-845-3988 Residential & Outpatient Substance Abuse Program  800-659-3381  Abuse/Neglect [place] Child Abuse Hotline (336) 641-3795 [place] Child Abuse Hotline 800-378-5315 (After Hours)  Emergency Shelter [facility] (336) 271-5985  [facility] (336) 275-9566 [facility] (704) 372-4663  MRSA Hotline #:   832-7006    [place] Resources  [facility] [place]     [employer]                          [facility] [address] [place]                       [address]      [address]  [place]                                                [place]                            [place] Phone:  [phone]                                   Phone:  [phone]                 Phone:  [phone]  [facility] Phone:  342-8316    [place] Child Abuse Hotline (336) 342-1394 (336) 342-3537 (After Hours)  Anxiety and Panic Attacks Your caregiver has informed you that you are having an anxiety or panic attack. There may be many forms of this. Most of the time these attacks come suddenly and without warning. They come at any time of day, including periods of sleep, and at any time of life. They may be strong and unexplained. Although panic attacks are very scary, they are physically harmless. Sometimes the cause of your anxiety is not known. Anxiety is a protective mechanism of the body in its fight or flight mechanism. Most of these perceived danger situations are actually nonphysical situations (such as anxiety over losing a job). CAUSES  The causes of an anxiety or panic attack are many. Panic attacks may occur in otherwise healthy people given a certain set of circumstances. There may be a genetic cause for panic attacks. Some medications may also have anxiety as a side effect. SYMPTOMS  Some of the most common feelings are:  Intense terror.   Dizziness, feeling faint.   Hot and cold flashes.   Fear of going crazy.   Feelings that nothing is real.   Sweating.   Shaking.   Chest pain or a fast heartbeat (palpitations).   Smothering, choking sensations.   Feelings of impending doom and that death is near.   Tingling of extremities, this may be from over-breathing.   Altered reality (derealization).   Being detached from yourself (depersonalization).  Several symptoms can be present to make up anxiety or panic attacks. DIAGNOSIS  The evaluation by your caregiver will depend on the type of symptoms you are  experiencing. The diagnosis of anxiety or panic attack is made when no physical illness can be determined to be a cause of the symptoms. TREATMENT  Treatment to prevent anxiety and panic attacks may include:  Avoidance of circumstances that cause anxiety.   Reassurance and relaxation.   Regular exercise.   Relaxation therapies, such as yoga.   Psychotherapy with a psychiatrist or therapist.   Avoidance of caffeine, alcohol and illegal drugs.   Prescribed medication.  SEEK IMMEDIATE MEDICAL CARE IF:   You experience panic attack symptoms that are different than your usual symptoms.   You have any worsening or concerning symptoms.  Document Released: 06/08/2005 Document Revised: 05/28/2011 Document Reviewed: 10/10/2009 [employer] Patient Information 2012 [employer]. 

## 2011-09-17 NOTE — ED Provider Notes (Signed)
I saw and evaluated the patient, reviewed the resident's note and I agree with the findings and plan.  I saw the patient along with Dr. Manson Passey and agree with his note, assessment, and plan.  The patient presented complaining of headache that started this morning followed by pain in the chest and numbness in hands.  She reports being under a lot of stress at work which is where this occurred.  On exam, she is anxious and tearful and vitals are stable.  The heart and lung exam is unremarkable and the abdomen is benign.  She is neurologically intact and there is no papilledema.  Workup was initiated including CT of the head, ekg, and labs.  These were all unremarkable and the patient was much improved with ativan and time.  I suspect she had some sort of anxiety attack and will be discharged to home.  Geoffery Lyons, MD 09/17/11 [phone]

## 2011-09-17 NOTE — ED Provider Notes (Signed)
History     CSN: [id number]  Arrival date & time 09/17/11  1037   First MD Initiated Contact with Patient 09/17/11 1051      Chief Complaint  Patient presents with  . Headache  . Chest Pain    (Consider location/radiation/quality/duration/timing/severity/associated sxs/prior treatment) HPI The patient is a 41 yo woman, history of anxiety, presenting with chest pain.  Around 8am this morning, the patient developed a severe headache, 10/10 in severity, involving the entire head diffusely.  She subsequently developed symptoms of nausea with 1 episode of vomiting, as well as a sensation of shortness of breath, and chest pain, described as a diffuse chest "heaviness", 10/10 in severity, non-radiating, constant since that time, not worsened by exertion or relieved by rest, not changed by food.  She also notes symptoms of bilateral arm "tingling", without numbness or motor weakness.  The patient called EMS, was given aspirin en route, and was taken to the ED.  The patient notes no history of HTN, HL, or smoking, and no FH of MI.  She notes a history of significant anxiety in the past, previously treated with benzos, but has been without treatment for over 1 year.  She notes no cough, fever, chills, rhinorrhea, neck pain, palpitations, abdominal pain, or diarrhea.  Past Medical History  Diagnosis Date  . NSVD (normal spontaneous vaginal delivery)   . Missed ab     X2  . Anxiety     Past Surgical History  Procedure Date  . Dilation and curettage of uterus     X2  . Appendectomy   . Pelvic laparoscopy   . Tubal ligation     BY LAPAROSCOPY  . Endometrial ablation   . Abdominal hysterectomy 12/03/2008    TLH/SLING SOLYX    Family History  Problem Relation Age of Onset  . Diabetes Mother   . Cancer Mother     LUNG/SMOKER  . Cancer Father     LUNG/SMOKER  . Breast cancer Maternal Aunt   . Diabetes Maternal Grandfather     History  Substance Use Topics  . Smoking status: Never  Smoker   . Smokeless tobacco: Never Used  . Alcohol Use: Yes    OB History    Grav Para Term Preterm Abortions TAB SAB Ect Mult Living   3 1   2  2   1       Review of Systems General: no fevers, chills, changes in weight, changes in appetite Skin: no rash HEENT: no blurry vision, hearing changes, sore throat Pulm: no coughing, wheezing CV: see HPI Abd: no abdominal pain, nausea/vomiting, diarrhea/constipation GU: no dysuria, hematuria, polyuria Ext: no arthralgias, myalgias Neuro: see HPI  Allergies  Ampicillin  Home Medications   Current Outpatient Rx  Name Route Sig Dispense Refill  . ASPIRIN EC 81 MG PO TBEC Oral Take 243 mg by mouth once.     Marland Kitchen CALCIUM CARBONATE 600 MG PO TABS Oral Take 600 mg by mouth daily.     . IBUPROFEN 200 MG PO TABS Oral Take 600 mg by mouth every 6 (six) hours as needed. For pain      BP 107/67  Temp(Src) 98.1 F (36.7 C) (Oral)  Resp 20  Ht 5' (1.524 m)  Wt 115 lb (52.164 kg)  BMI 22.46 kg/m2  SpO2 100%  Physical Exam General: alert, cooperative, appears very anxious HEENT: pupils equal round and reactive to light, vision grossly intact, oropharynx clear and non-erythematous  Neck: supple, no lymphadenopathy, no  JVD Lungs: clear to ascultation bilaterally, mild tachypnea, no wheezes, rales, ronchi Heart: regular rate and rhythm, no murmurs, gallops, or rubs Abdomen: soft, non-tender, non-distended, normal bowel sounds Extremities: no cyanosis, clubbing, or edema Neurologic: alert & oriented X3, cranial nerves II-XII intact, strength 5/5 throughout, sensation intact to light touch throughout, reflexes 2+ throughout   ED Course  Procedures (including critical care time)   Labs Reviewed  CBC  DIFFERENTIAL  BASIC METABOLIC PANEL  CARDIAC PANEL(CRET KIN+CKTOT+MB+TROPI)   No results found.   No diagnosis found.    MDM  The patient is a 41 yo woman, history of anxiety, presenting with "heavy" diffuse chest pain  # Chest  pain - symptoms appear consistent with panic attack.  Less likely ACS, no risk factors, TIMI score = 0. -cxr unremarkable -troponin negative -cbc, bmet reviewed, wnl  # Headache/vomiting - likely related to patient's anxiety, but must rule out increased ICP -CT head - unremarkable, no increased ICP -hydrocodone  # Dispo - discharge home, f/u with PCP   Linward Headland, MD 09/17/11 [phone]

## 2011-09-17 NOTE — ED Notes (Signed)
Pt was brought in by EMS with c/o headache, chest discomfort and vomiting. EMS claimed that she was hyperventilating. Pt also claimed that she has been in a lot stress lately.

## 2011-09-17 NOTE — ED Notes (Signed)
Pt claimed that her headache is better

## 2011-09-17 NOTE — ED Notes (Signed)
Pt crying, skin is warm and dry, respiration is even and unlabored. Will continue to monitor

## 2011-10-15 ENCOUNTER — Ambulatory Visit (INDEPENDENT_AMBULATORY_CARE_PROVIDER_SITE_OTHER): Payer: 59 | Admitting: Gynecology

## 2011-10-15 ENCOUNTER — Encounter: Payer: Self-pay | Admitting: Gynecology

## 2011-10-15 VITALS — BP 120/80 | Ht 60.0 in | Wt 125.0 lb

## 2011-10-15 DIAGNOSIS — F419 Anxiety disorder, unspecified: Secondary | ICD-10-CM

## 2011-10-15 DIAGNOSIS — Z833 Family history of diabetes mellitus: Secondary | ICD-10-CM

## 2011-10-15 DIAGNOSIS — F411 Generalized anxiety disorder: Secondary | ICD-10-CM

## 2011-10-15 DIAGNOSIS — Z01419 Encounter for gynecological examination (general) (routine) without abnormal findings: Secondary | ICD-10-CM

## 2011-10-15 MED ORDER — ALPRAZOLAM 0.25 MG PO TABS: 0.2500 mg | ORAL_TABLET | Freq: Every evening | ORAL | Status: AC | PRN

## 2011-10-15 NOTE — Progress Notes (Signed)
[name] [birth year] [id number]   History:    41 y.o.  for annual exam with complaints of anxiety. Patient had gone to the emergency room several weeks ago as a result of anxiety and panic attack that she had. She's doing with issues at home with her husband who is applying for disability. She is now coping well she does not have any signs of depression when questioned. She has good family support. She had taken Xanax in the past which helped when she took it on a when necessary basis. Patient has a history of a total laparoscopic hysterectomy and suburethral sling (Solex) and has done well since her surgery.  Past medical history,surgical history, family history and social history were all reviewed and documented in the EPIC chart.  Gynecologic History No LMP recorded. Patient has had a hysterectomy. Contraception: none Last Pap: 2012. Results were: normal Last mammogram: No prior study. Results were: No prior study  Obstetric History OB History    Grav Para Term Preterm Abortions TAB SAB Ect Mult Living   3 1  1 2  2   1      # Outc Date GA Lbr Len/2nd Wgt Sex Del Anes PTL Lv   1 PRE     M SVD  Yes Yes   2 SAB            3 SAB                ROS:  Was performed and pertinent positives and negatives are included in the history.  Exam: chaperone present  BP 120/80  Ht 5' (1.524 m)  Wt 125 lb (56.7 kg)  BMI 24.41 kg/m2  Body mass index is 24.41 kg/(m^2).  General appearance : Well developed well nourished female. No acute distress HEENT: Neck supple, trachea midline, no carotid bruits, no thyroidmegaly Lungs: Clear to auscultation, no rhonchi or wheezes, or rib retractions  Heart: Regular rate and rhythm, no murmurs or gallops Breast:Examined in sitting and supine position were symmetrical in appearance, no palpable masses or tenderness,  no skin retraction, no nipple inversion, no nipple discharge, no skin discoloration, no axillary or supraclavicular  lymphadenopathy Abdomen: no palpable masses or tenderness, no rebound or guarding Extremities: no edema or skin discoloration or tenderness  Pelvic:  Bartholin, Urethra, Skene Glands: Within normal limits             Vagina: No gross lesions or discharge  Cervix: Uterus   Adnexa  Without masses or tenderness  Anus and perineum  normal   Rectovaginal  normal sphincter tone without palpated masses or tenderness             Hemoccult not indicated     Assessment/Plan:  41 y.o. female for annual exam with episodes of anxiety. She will be restarted again on Xanax 0.5 mg which she can take 1 daily on a when necessary basis. A requisition was provided for her to schedule her baseline mammogram. She was encouraged to do her monthly self breast examinations. We discussed the new Pap smear screening guidelines and she will not need one any longer since she has had a hysterectomy and has had negative Pap smears in the past. Patient with strong family history diabetes and for this reason we'll do a random blood sugar will also check her CBC screening cholesterol and urinalysis. We'll see her back in one year or when necessary.    Ok Edwards MD, 5:07 PM 10/15/2011

## 2011-10-15 NOTE — Patient Instructions (Signed)
Remember to schedule your mammogram 

## 2011-10-16 LAB — CBC WITH DIFFERENTIAL/PLATELET
Basophils Absolute: 0.1 10*3/uL (ref 0.0–0.1)
Eosinophils Absolute: 0.5 10*3/uL (ref 0.0–0.7)
Eosinophils Relative: 7 % — ABNORMAL HIGH (ref 0–5)
MCH: 29.6 pg (ref 26.0–34.0)
MCHC: 31.5 g/dL (ref 30.0–36.0)
MCV: 94 fL (ref 78.0–100.0)
Platelets: 277 10*3/uL (ref 150–400)
RDW: 13.2 % (ref 11.5–15.5)

## 2011-10-19 LAB — URINALYSIS W MICROSCOPIC + REFLEX CULTURE

## 2011-10-30 ENCOUNTER — Other Ambulatory Visit: Payer: Self-pay | Admitting: *Deleted

## 2011-10-30 DIAGNOSIS — R7989 Other specified abnormal findings of blood chemistry: Secondary | ICD-10-CM

## 2013-01-12 ENCOUNTER — Encounter: Payer: Self-pay | Admitting: Gynecology

## 2013-01-13 ENCOUNTER — Encounter: Payer: Self-pay | Admitting: Gynecology

## 2013-01-13 ENCOUNTER — Ambulatory Visit (INDEPENDENT_AMBULATORY_CARE_PROVIDER_SITE_OTHER): Payer: 59 | Admitting: Gynecology

## 2013-01-13 VITALS — BP 118/70 | Ht 61.0 in | Wt 125.0 lb

## 2013-01-13 DIAGNOSIS — N3281 Overactive bladder: Secondary | ICD-10-CM | POA: Insufficient documentation

## 2013-01-13 DIAGNOSIS — N318 Other neuromuscular dysfunction of bladder: Secondary | ICD-10-CM

## 2013-01-13 DIAGNOSIS — Z01419 Encounter for gynecological examination (general) (routine) without abnormal findings: Secondary | ICD-10-CM

## 2013-01-13 DIAGNOSIS — Z833 Family history of diabetes mellitus: Secondary | ICD-10-CM

## 2013-01-13 MED ORDER — FESOTERODINE FUMARATE ER 4 MG PO TB24: 4.0000 mg | ORAL_TABLET | Freq: Every day | ORAL | Status: DC

## 2013-01-13 NOTE — Patient Instructions (Addendum)
Fesoterodine extended-release tablets  What is this medicine?  FESOTERODINE (fes oh TER oh deen) is used to treat overactive bladder. This medicine reduces the amount of bathroom visits.  This medicine may be used for other purposes; ask your health care provider or pharmacist if you have questions.  What should I tell my health care provider before I take this medicine?  They need to know if you have any of these conditions:  -difficulty passing urine  -glaucoma  -intestinal obstruction  -kidney disease  -liver disease  -an unusual or allergic reaction to fesoterodine, tolterodine, other medicines, foods, dyes, or preservatives  -pregnant or trying to get pregnant  -breast-feeding  How should I use this medicine?  Take this medicine by mouth with a glass of water. Follow the directions on the prescription label. Do not cut, crush or chew this medicine. Take your doses at regular intervals. Do not take your medicine more often than directed.  Talk to your pediatrician regarding the use of this medicine in children. Special care may be needed.  Overdosage: If you think you have taken too much of this medicine contact a poison control center or emergency room at once.  NOTE: This medicine is only for you. Do not share this medicine with others.  What if I miss a dose?  If you miss a dose, take it as soon as you can. If it is almost time for your next dose, take only that dose. Do not take double or extra doses.  What may interact with this medicine?  -antihistamines for allergy, cough and cold  -atropine  -certain medicines for bladder problems like oxybutynin, tolterodine  -certain medicines for Parkinson's disease like benztropine, trihexyphenidyl  -certain medicines for stomach problems like dicyclomine, hyoscyamine  -certain medicines for travel sickness like scopolamine  -clarithromycin  -ipratropium  -itraconazole  -ketoconazole  -rifampin  This list may not describe all possible interactions. Give your health  care provider a list of all the medicines, herbs, non-prescription drugs, or dietary supplements you use. Also tell them if you smoke, drink alcohol, or use illegal drugs. Some items may interact with your medicine.  What should I watch for while using this medicine?  It may take 2 or 3 months to notice the full benefit from this medicine. Your health care professional may also recommend techniques that may help improve control of your bladder and sphincter muscles. These techniques will help you need the bathroom less frequently.  You may need to limit your intake of tea, coffee, caffeinated sodas, and alcohol. These drinks may make your symptoms worse. Keeping healthy bowel habits may lessen bladder symptoms. If you currently smoke, quitting smoking may help reduce irritation to the bladder muscle.  You may get drowsy or dizzy. Do not drive, use machinery, or do anything that needs mental alertness until you know how this drug affects you. Do not stand or sit up quickly, especially if you are an older patient. This reduces the risk of dizzy or fainting spells.  Your mouth may get dry. Chewing sugarless gum or sucking hard candy and drinking plenty of water will help.  This medicine may cause dry eyes and blurred vision. If you wear contact lenses you may feel some discomfort. Lubricating drops may help. See your eye doctor if the problem does not go away or is severe.  What side effects may I notice from receiving this medicine?  Side effects that you should report to your doctor or health care professional as   urine Side effects that usually do not require medical attention (report to your doctor or health care professional if they continue or are  bothersome): -changes in vision -constipation -dizziness -dry eyes or mouth -nausea -stomach upset -tiredness This list may not describe all possible side effects. Call your doctor for medical advice about side effects. You may report side effects to FDA at 1-800-FDA-1088. Where should I keep my medicine? Keep out of the reach of children. Store at room temperature between 15 and 30 degrees C (59 and 86 degrees F). Protect from moisture. Throw away any unused medicine after the expiration date. NOTE: This sheet is a summary. It may not cover all possible information. If you have questions about this medicine, talk to your doctor, pharmacist, or health care provider.  2013, [employer]. (08/07/2009 12:25:12 PM) Overactive Bladder, Adult The bladder has two functions that are totally opposite of the other. One is to relax and stretch out so it can store urine (fills like a balloon), and the other is to contract and squeeze down so that it can empty the urine that it has stored. Proper functioning of the bladder is a complex mixing of these two functions. The filling and emptying of the bladder can be influenced by:  The bladder.  The spinal cord.  The brain.  The nerves going to the bladder.  Other organs that are closely related to the bladder such as prostate in males and the vagina in females. As your bladder fills with urine, nerve signals are sent from the bladder to the brain to tell you that you may need to urinate. Normal urination requires that the bladder squeeze down with sufficient strength to empty the bladder, but this also requires that the bladder squeeze down sufficiently long to finish the job. In addition the sphincter muscles, which normally keep you from leaking urine, must also relax so that the urine can pass. Coordination between the bladder muscle squeezing down and the sphincter muscles relaxing is required to make everything happen normally. With an  overactive bladder sometimes the muscles of the bladder contract unexpectedly and involuntarily and this causes an urgent need to urinate. The normal response is to try to hold urine in by contracting the sphincter muscles. Sometimes the bladder contracts so strongly that the sphincter muscles cannot stop the urine from passing out and incontinence occurs. This kind of incontinence is called urge incontinence. Having an overactive bladder can be embarrassing and awkward. It can keep you from living life the way you want to. Many people think it is just something you have to put up with as you grow older or have certain health conditions. In fact, there are treatments that can help make your life easier and more pleasant. CAUSES  Many things can cause an overactive bladder. Possibilities include:  Urinary tract infection or infection of nearby tissues such as the prostate.  Prostate enlargement.  In women, multiple pregnancies or surgery on the uterus or urethra.  Bladder stones, inflammation or tumors.  Caffeine.  Alcohol.  Medications. For example, diuretics (drugs that help the body get rid of extra fluid) increase urine production. Some other medicines must be taken with lots of fluids.  Muscle or nerve weakness. This might be the result of a spinal cord injury, a stroke, multiple sclerosis or Parkinson's disease.  Diabetes can cause a high urine volume which fills the bladder so quickly that the normal urge to urinate is triggered very strongly. SYMPTOMS   Loss of  bladder control. You feel the need to urinate and cannot make your body wait.  Sudden, strong urges to urinate.  Urinating 8 or more times a day.  Waking up to urinate two or more times a night. DIAGNOSIS  To decide if you have overactive bladder, your healthcare provider will probably:  Ask about symptoms you have noticed.  Ask about your overall health. This will include questions about any medications you are  taking.  Do a physical examination. This will help determine if there are obvious blockages or other problems.  Order some tests. These might include:  A blood test to check for diabetes or other health issues that could be contributing to the problem.  Urine testing. This could measure the flow of urine and the pressure on the bladder.  A test of your neurological system (the brain, spinal cord and nerves). This is the system that senses the need to urinate. Some of these tests are called flow tests, bladder pressure tests and electrical measurements of the sphincter muscle.  A bladder test to check whether it is emptying completely when you urinate.  Cytoscopy. This test uses a thin tube with a tiny camera on it. It offers a look inside your urethra and bladder to see if there are problems.  Imaging tests. You might be given a contrast dye and then asked to urinate. X-rays are taken to see how your bladder is working. TREATMENT  An overactive bladder can be treated in many ways. The treatment will depend on the cause. Whether you have a mild or severe case also makes a difference. Often, treatment can be given in your healthcare provider's office or clinic. Be sure to discuss the different options with your caregiver. They include:  Behavioral treatments. These do not involve medication or surgery:  Bladder training. For this, you would follow a schedule to urinate at regular intervals. This helps you learn to control the urge to urinate. At first, you might be asked to wait a few minutes after feeling the urge. In time, you should be able to schedule bathroom visits an hour or more apart.  Kegel exercises. These exercises strengthen the pelvic floor muscles, which support the bladder. By toning these muscles, they can help control urination, even if the bladder muscles are overactive. A specialist will teach you how to do these exercises correctly. They will require daily  practice.  Weight loss. If you are obese or overweight, losing weight might stop your bladder from being overactive. Talk to your healthcare provider about how many pounds you should lose. Also ask if there is a specific program or method that would work best for you.  Diet change. This might be suggested if constipation is making your overactive bladder worse. Your healthcare provider or a nutritionist can explain ways to change what you eat to ease constipation. Other people might need to take in less caffeine or alcohol. Sometimes drinking fewer fluids is needed, too.  Protection. This is not an actual treatment. But, you could wear special pads to take care of any leakage while you wait for other treatments to take effect. This will help you avoid embarrassment.  Physical treatments.  Electrical stimulation. Electrodes will send gentle pulses to the nerves or muscles that help control the bladder. The goal is to strengthen them. Sometimes this is done with the electrodes outside of the body. Or, they might be placed inside the body (implanted). This treatment can take several months to have an effect.  Medications. These are usually used along with other treatments. Several medicines are available. Some are injected into the muscles involved in urination. Others come in pill form. Medications sometimes prescribed include:  Anticholinergics. These drugs block the signals that the nerves deliver to the bladder. This keeps it from releasing urine at the wrong time. Researchers think the drugs might help in other ways, too.  Imipramine. This is an antidepressant. But, it relaxes bladder muscles.  Botox. This is still experimental. Some people believe that injecting it into the bladder muscles will relax them so they work more normally. It has also been injected into the sphincter muscle when the sphincter muscle does not open properly. This is a temporary fix, however. Also, it might make matters  worse, especially in older people.  Surgery.  A device might be implanted to help manage your nerves. It works on the nerves that signal when you need to urinate.  Surgery is sometimes needed with electrical stimulation. If the electrodes are implanted, this is done through surgery.  Sometimes repairs need to be made through surgery. For example, the size of the bladder can be changed. This is usually done in severe cases only. HOME CARE INSTRUCTIONS   Take any medications your healthcare provider prescribed or suggested. Follow the directions carefully.  Practice any lifestyle changes that are recommended. These might include:  Drinking less fluid or drinking at different times of the day. If you need to urinate often during the night, for example, you may need to stop drinking fluids early in the evening.  Cutting down on caffeine or alcohol. They can both make an overactive bladder worse. Caffeine is found in coffee, tea and sodas.  Doing Kegel exercises to strengthen muscles.  Losing weight, if that is recommended.  Eating a healthy and balanced diet. This will help you avoid constipation.  Keep a journal or a log. You might be asked to record how much you drink and when, and also when you feel the need to urinate.  Learn how to care for implants or other devices, such as pessaries. SEEK MEDICAL CARE IF:   Your overactive bladder gets worse.  You feel increased pain or irritation when you urinate.  You notice blood in your urine.  You have questions about any medications or devices that your healthcare provider recommended.  You notice blood, pus or swelling at the site of any test or treatment procedure.  You have an oral temperature above 102 F (38.9 C). SEEK IMMEDIATE MEDICAL CARE IF:  You have an oral temperature above 102 F (38.9 C), not controlled by medicine. Document Released: 04/04/2009 Document Revised: 08/31/2011 Document Reviewed: 04/04/2009 [facility]  Patient Information 2014 [place], [place].

## 2013-01-13 NOTE — Progress Notes (Signed)
[name] [birth year] [id number]   History:    42 y.o.  for annual gyn exam with complaint over the past few months of urgency incontinence and nocturia. 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 She has done well from her surgery otherwise. Patient denies any prior history of abnormal Pap smear. She had a normal baseline mammogram in 2007. Patient with strong family history of diabetes.  Past medical history,surgical history, family history and social history were all reviewed and documented in the EPIC chart.  Gynecologic History No LMP recorded. Patient has had a hysterectomy. Contraception: status post hysterectomy Last Pap: 2012. Results were: normal Last mammogram: 2007. Results were: normal  Obstetric History OB History   Grav Para Term Preterm Abortions TAB SAB Ect Mult Living   3 1  1 2  2   1      # Outc Date GA Lbr Len/2nd Wgt Sex Del Anes PTL Lv   1 PRE     M SVD  Yes Yes   2 SAB            3 SAB                ROS: A ROS was performed and pertinent positives and negatives are included in the history.  GENERAL: No fevers or chills. HEENT: No change in vision, no earache, sore throat or sinus congestion. NECK: No pain or stiffness. CARDIOVASCULAR: No chest pain or pressure. No palpitations. PULMONARY: No shortness of breath, cough or wheeze. GASTROINTESTINAL: No abdominal pain, nausea, vomiting or diarrhea, melena or bright red blood per rectum. GENITOURINARY:urgency incontinence and nocturia MUSCULOSKELETAL: No joint or muscle pain, no back pain, no recent trauma. DERMATOLOGIC: No rash, no itching, no lesions. ENDOCRINE: No polyuria, polydipsia, no heat or cold intolerance. No recent change in weight. HEMATOLOGICAL: No anemia or easy bruising or bleeding. NEUROLOGIC: No headache, seizures, numbness,  tingling or weakness. PSYCHIATRIC: No depression, no loss of interest in normal activity or change in sleep pattern.     Exam: chaperone present  BP 118/70  Ht 5\' 1"  (1.549 m)  Wt 125 lb (56.7 kg)  BMI 23.63 kg/m2  Body mass index is 23.63 kg/(m^2).  General appearance : Well developed well nourished female. No acute distress HEENT: Neck supple, trachea midline, no carotid bruits, no thyroidmegaly Lungs: Clear to auscultation, no rhonchi or wheezes, or rib retractions  Heart: Regular rate and rhythm, no murmurs or gallops Breast:Examined in sitting and supine position were symmetrical in appearance, no palpable masses or tenderness,  no skin retraction, no nipple inversion, no nipple discharge, no skin discoloration, no axillary or supraclavicular lymphadenopathy Abdomen: no palpable masses or tenderness, no rebound or guarding Extremities: no edema or skin discoloration or tenderness  Pelvic:  Bartholin, Urethra, Skene Glands: Within normal limits             Vagina: No gross lesions or discharge  Cervix: absent  Uterus Absent  Adnexa  Without masses or tenderness  Anus and perineum  normal   Rectovaginal  normal sphincter tone without palpated masses or tenderness             Hemoccult none indicated   Patient was examined in the supine and erect position and on Valsalva she did not leak urine. A sterile Q-tip in pregnant it with 2% lidocaine was placed in the  urethra and on Valsalva maneuver UV angle was less than 30 there was good vaginal support no evidence of cystocele or rectocele or pelvic organ prolapse. No erosion from sling noted  Assessment/Plan:  42 y.o. 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 We discussed with the patient treating this entity with an anticholinergic   agent such as  Toviaz 4 mg daily. Patient denies any history of glaucoma. The risks benefits and pros and cons of the medication were discussed with the  patient. Literature and information on the medication as well as on overactive bladder was provided. The following lab work today: Hemoglobin A1c, CBC, screen cholesterol, TSH, as well as urinalysis. No Pap smear done today the new guidelines discussed.  Ok Edwards MD, 5:07 PM 01/13/2013

## 2013-01-14 LAB — CBC WITH DIFFERENTIAL/PLATELET
Basophils Absolute: 0.1 10*3/uL (ref 0.0–0.1)
Basophils Relative: 1 % (ref 0–1)
Eosinophils Absolute: 0.6 10*3/uL (ref 0.0–0.7)
Lymphs Abs: 2.8 10*3/uL (ref 0.7–4.0)
MCH: 30.5 pg (ref 26.0–34.0)
Neutrophils Relative %: 51 % (ref 43–77)
Platelets: 301 10*3/uL (ref 150–400)
RBC: 4.69 MIL/uL (ref 3.87–5.11)
RDW: 14 % (ref 11.5–15.5)

## 2013-01-14 LAB — URINALYSIS W MICROSCOPIC + REFLEX CULTURE
Bilirubin Urine: NEGATIVE
Glucose, UA: NEGATIVE mg/dL
Leukocytes, UA: NEGATIVE
Protein, ur: NEGATIVE mg/dL
Urobilinogen, UA: 0.2 mg/dL (ref 0.0–1.0)

## 2013-01-14 LAB — CHOLESTEROL, TOTAL: Cholesterol: 205 mg/dL — ABNORMAL HIGH (ref 0–200)

## 2013-01-14 LAB — HEMOGLOBIN A1C: Hgb A1c MFr Bld: 5.2 % (ref <5.7)

## 2013-01-16 ENCOUNTER — Other Ambulatory Visit: Payer: Self-pay | Admitting: Gynecology

## 2013-01-16 DIAGNOSIS — E7800 Pure hypercholesterolemia, unspecified: Secondary | ICD-10-CM

## 2013-01-16 MED ORDER — NITROFURANTOIN MONOHYD MACRO 100 MG PO CAPS: 100.0000 mg | ORAL_CAPSULE | Freq: Two times a day (BID) | ORAL | Status: DC

## 2013-01-16 MED ORDER — FLUCONAZOLE 100 MG PO TABS: 100.0000 mg | ORAL_TABLET | Freq: Every day | ORAL | Status: DC

## 2013-01-17 ENCOUNTER — Encounter: Payer: Self-pay | Admitting: Gynecology

## 2013-01-17 LAB — URINE CULTURE

## 2013-01-23 ENCOUNTER — Encounter: Payer: Self-pay | Admitting: Gynecology

## 2013-05-08 ENCOUNTER — Telehealth: Payer: Self-pay | Admitting: *Deleted

## 2013-05-08 NOTE — Telephone Encounter (Signed)
Please call him prescription for Myrbetriq 25 mg daily. Tell her that if she would like she can come by the office and we can give her some samples to start. Please call in 30 tablets with 11 refills

## 2013-05-08 NOTE — Telephone Encounter (Signed)
Pt was given Rx for Toviaz 4 mg daily on  01/13/13, pharmacy said that provider should consider alternative medication Gelnique, myrbetriq, or vesicare which is covered. Let me know if any of the above would be okay to switch. Please advise

## 2013-05-09 MED ORDER — MIRABEGRON ER 25 MG PO TB24: 25.0000 mg | ORAL_TABLET | Freq: Every day | ORAL | Status: DC

## 2013-05-09 NOTE — Telephone Encounter (Signed)
rx sent to pharmacy, informed pt this has been done.

## 2013-09-23 ENCOUNTER — Emergency Department (HOSPITAL_COMMUNITY): Payer: Managed Care, Other (non HMO)

## 2013-09-23 ENCOUNTER — Encounter (HOSPITAL_COMMUNITY): Payer: Self-pay | Admitting: Emergency Medicine

## 2013-09-23 ENCOUNTER — Emergency Department (HOSPITAL_COMMUNITY)
Admission: EM | Admit: 2013-09-23 | Discharge: 2013-09-24 | Disposition: A | Discharge status: Home / Self Care | Payer: Managed Care, Other (non HMO) | Attending: Emergency Medicine | Admitting: Emergency Medicine

## 2013-09-23 DIAGNOSIS — R079 Chest pain, unspecified: Secondary | ICD-10-CM

## 2013-09-23 DIAGNOSIS — Z9089 Acquired absence of other organs: Secondary | ICD-10-CM | POA: Insufficient documentation

## 2013-09-23 DIAGNOSIS — R112 Nausea with vomiting, unspecified: Secondary | ICD-10-CM | POA: Insufficient documentation

## 2013-09-23 DIAGNOSIS — Z792 Long term (current) use of antibiotics: Secondary | ICD-10-CM | POA: Insufficient documentation

## 2013-09-23 DIAGNOSIS — Z8659 Personal history of other mental and behavioral disorders: Secondary | ICD-10-CM | POA: Insufficient documentation

## 2013-09-23 DIAGNOSIS — R1011 Right upper quadrant pain: Secondary | ICD-10-CM | POA: Insufficient documentation

## 2013-09-23 DIAGNOSIS — R1110 Vomiting, unspecified: Secondary | ICD-10-CM

## 2013-09-23 DIAGNOSIS — R0789 Other chest pain: Secondary | ICD-10-CM | POA: Insufficient documentation

## 2013-09-23 DIAGNOSIS — Z90710 Acquired absence of both cervix and uterus: Secondary | ICD-10-CM | POA: Insufficient documentation

## 2013-09-23 DIAGNOSIS — R109 Unspecified abdominal pain: Secondary | ICD-10-CM

## 2013-09-23 DIAGNOSIS — Z7982 Long term (current) use of aspirin: Secondary | ICD-10-CM | POA: Insufficient documentation

## 2013-09-23 DIAGNOSIS — R6883 Chills (without fever): Secondary | ICD-10-CM | POA: Insufficient documentation

## 2013-09-23 DIAGNOSIS — R1013 Epigastric pain: Secondary | ICD-10-CM | POA: Insufficient documentation

## 2013-09-23 DIAGNOSIS — Z9851 Tubal ligation status: Secondary | ICD-10-CM | POA: Insufficient documentation

## 2013-09-23 DIAGNOSIS — Z79899 Other long term (current) drug therapy: Secondary | ICD-10-CM | POA: Insufficient documentation

## 2013-09-23 DIAGNOSIS — R0602 Shortness of breath: Secondary | ICD-10-CM | POA: Insufficient documentation

## 2013-09-23 LAB — I-STAT TROPONIN, ED: Troponin i, poc: 0 ng/mL (ref 0.00–0.08)

## 2013-09-23 LAB — CBC
HCT: 45.1 % (ref 36.0–46.0)
Hemoglobin: 15.9 g/dL — ABNORMAL HIGH (ref 12.0–15.0)
MCH: 31.5 pg (ref 26.0–34.0)
MCHC: 35.3 g/dL (ref 30.0–36.0)
MCV: 89.3 fL (ref 78.0–100.0)
Platelets: 243 10*3/uL (ref 150–400)
RBC: 5.05 MIL/uL (ref 3.87–5.11)
RDW: 12.8 % (ref 11.5–15.5)
WBC: 6.2 10*3/uL (ref 4.0–10.5)

## 2013-09-23 LAB — BASIC METABOLIC PANEL
BUN: 18 mg/dL (ref 6–23)
CHLORIDE: 103 meq/L (ref 96–112)
CO2: 23 mEq/L (ref 19–32)
CREATININE: 0.85 mg/dL (ref 0.50–1.10)
Calcium: 9.4 mg/dL (ref 8.4–10.5)
GFR calc non Af Amer: 83 mL/min — ABNORMAL LOW (ref >90)
Glucose, Bld: 115 mg/dL — ABNORMAL HIGH (ref 70–99)
POTASSIUM: 4.1 meq/L (ref 3.7–5.3)
Sodium: 140 mEq/L (ref 137–147)

## 2013-09-23 LAB — PRO B NATRIURETIC PEPTIDE: Pro B Natriuretic peptide (BNP): 36.9 pg/mL (ref 0–125)

## 2013-09-23 LAB — HEPATIC FUNCTION PANEL
ALBUMIN: 4 g/dL (ref 3.5–5.2)
ALT: 15 U/L (ref 0–35)
AST: 32 U/L (ref 0–37)
Alkaline Phosphatase: 90 U/L (ref 39–117)
Bilirubin, Direct: <0.2 mg/dL (ref 0.0–0.3)
TOTAL PROTEIN: 7.5 g/dL (ref 6.0–8.3)
Total Bilirubin: 0.6 mg/dL (ref 0.3–1.2)

## 2013-09-23 LAB — LIPASE, BLOOD: Lipase: 34 U/L (ref 11–59)

## 2013-09-23 MED ORDER — SODIUM CHLORIDE 0.9 % IV BOLUS (SEPSIS)
1000.0000 mL | Freq: Once | INTRAVENOUS | Status: AC
  Administered 2013-09-23: 1000 mL via INTRAVENOUS

## 2013-09-23 MED ORDER — NITROGLYCERIN 0.4 MG SL SUBL: 0.4000 mg | SUBLINGUAL_TABLET | SUBLINGUAL | Status: DC | PRN

## 2013-09-23 MED ORDER — GI COCKTAIL ~~LOC~~
30.0000 mL | Freq: Once | ORAL | Status: AC
  Administered 2013-09-23: 30 mL via ORAL
  Filled 2013-09-23: qty 30

## 2013-09-23 MED ORDER — ASPIRIN 81 MG PO CHEW: 324.0000 mg | CHEWABLE_TABLET | Freq: Once | ORAL | Status: DC

## 2013-09-23 MED ORDER — ONDANSETRON HCL 4 MG/2ML IJ SOLN
4.0000 mg | Freq: Once | INTRAMUSCULAR | Status: AC
  Administered 2013-09-23: 4 mg via INTRAVENOUS
  Filled 2013-09-23: qty 2

## 2013-09-23 NOTE — ED Notes (Signed)
Pt. reports mid chest pain onset this evening with SOB and occasional dry cough , also reported nausea and vomitting this afternoon after eating hotdog .

## 2013-09-23 NOTE — ED Notes (Signed)
Pt back from x-ray.

## 2013-09-23 NOTE — ED Notes (Signed)
Dr Elesa MassedWard in room

## 2013-09-23 NOTE — ED Notes (Signed)
Pt taken to xray 

## 2013-09-23 NOTE — ED Provider Notes (Signed)
TIME SEEN: 9:50 PM  CHIEF COMPLAINT: Chest pain, abdominal pain, shortness of breath, nausea and vomiting  HPI: Patient is a 43 year old female with no significant past medical history who presents to the emergency department with central chest pain, epigastric pain and shortness of breath that started after eating a hot dog this afternoon. She states she has had nausea and vomiting since. She states earlier today she was not feeling well. She has had chills. No fevers. Her pain is not exertional or pleuritic. She describes her pain as a pressure. No history of hypertension, diabetes, hyperlipidemia or tobacco abuse. No family history of CAD. No history of PE, DVT, prolonged immobilization, exogenous hormone use, lower extreme swelling or pain, recent fracture or surgery or trauma. Patient does have a history of an appendectomy, bilateral tubal ligation and hysterectomy.  ROS: See HPI Constitutional: no fever  Eyes: no drainage  ENT: no runny nose   Cardiovascular:  chest pain  Resp: SOB  GI:  vomiting GU: no dysuria Integumentary: no rash  Allergy: no hives  Musculoskeletal: no leg swelling  Neurological: no slurred speech ROS otherwise negative  PAST MEDICAL HISTORY/PAST SURGICAL HISTORY:  Past Medical History  Diagnosis Date  . NSVD (normal spontaneous vaginal delivery)   . Missed ab     X2  . Anxiety     MEDICATIONS:  Prior to Admission medications   Medication Sig Start Date End Date Taking? Authorizing Provider  aspirin EC 81 MG tablet Take 243 mg by mouth once.     Historical Provider, MD  calcium carbonate (OS-CAL) 600 MG TABS Take 600 mg by mouth daily.     Historical Provider, MD  fluconazole (DIFLUCAN) 100 MG tablet Take 1 tablet (100 mg total) by mouth daily. 01/16/13   Ok Edwards, MD  ibuprofen (ADVIL,MOTRIN) 200 MG tablet Take 600 mg by mouth every 6 (six) hours as needed. For pain    Historical Provider, MD  mirabegron ER (MYRBETRIQ) 25 MG TB24 tablet Take 1  tablet (25 mg total) by mouth daily. 05/09/13   Ok Edwards, MD  nitrofurantoin, macrocrystal-monohydrate, (MACROBID) 100 MG capsule Take 1 capsule (100 mg total) by mouth 2 (two) times daily. 01/16/13   Ok Edwards, MD    ALLERGIES:  Allergies  Allergen Reactions  . Ampicillin Other (See Comments)    unknown    SOCIAL HISTORY:  History  Substance Use Topics  . Smoking status: Never Smoker   . Smokeless tobacco: Never Used  . Alcohol Use: Yes    FAMILY HISTORY: Family History  Problem Relation Age of Onset  . Diabetes Mother   . Cancer Mother     LUNG/SMOKER  . Cancer Father     LUNG/SMOKER  . Breast cancer Maternal Aunt   . Diabetes Maternal Grandfather     EXAM: BP 92/79  Pulse 91  Temp(Src) 97.6 F (36.4 C) (Oral)  Resp 12  SpO2 100% CONSTITUTIONAL: Alert and oriented and responds appropriately to questions. Appears uncomfortable but is nontoxic appearing HEAD: Normocephalic EYES: Conjunctivae clear, PERRL ENT: normal nose; no rhinorrhea; moist mucous membranes; pharynx without lesions noted NECK: Supple, no meningismus, no LAD  CARD: RRR; S1 and S2 appreciated; no murmurs, no clicks, no rubs, no gallops RESP: Normal chest excursion without splinting or tachypnea; breath sounds clear and equal bilaterally; no wheezes, no rhonchi, no rales,  ABD/GI: Normal bowel sounds; non-distended; tender to palpation in the epigastric region and right upper quadrant with a positive Murphy sign, no  rebound, no peritoneal signs BACK:  The back appears normal and is non-tender to palpation, there is no CVA tenderness EXT: Normal ROM in all joints; non-tender to palpation; no edema; normal capillary refill; no cyanosis    SKIN: Normal color for age and race; warm NEURO: Moves all extremities equally PSYCH: The patient's mood and manner are appropriate. Grooming and personal hygiene are appropriate.  MEDICAL DECISION MAKING: Patient here with very atypical chest pain.  Suspect his gastric in nature versus cholecystitis, cholelithiasis, pancreatitis. She states her pain is been constant since 2:30 PM. Her EKG shows no ischemic changes and her troponin is negative. Chest x-ray shows no infiltrate or edema. We'll obtain LFTs, lipase and abdominal ultrasound. We'll give GI cocktail.  ED PROGRESS: LFTs, lipase are normal. Patient reports feeling much better after GI cocktail and Zofran. Ultrasound pending.  1:21 AM  Pt's abdominal ultrasound shows a small fatty liver but no other acute abnormality. No abdominal aneurysm. She has been able to tolerate by mouth in the emergency department. She is hemodynamically stable. We'll discharge her with prescription for Zofran and omeprazole. Have discussed return precautions. Patient and husband at bedside verbalize understanding and are comfortable plan.      EKG Interpretation  Date/Time:  Saturday September 23 2013 21:02:26 EDT Ventricular Rate:  112 PR Interval:  122 QRS Duration: 80 QT Interval:  296 QTC Calculation: 404 R Axis:   68 Text Interpretation:  Sinus tachycardia Possible Left atrial enlargement Cannot rule out Anterior infarct , age undetermined Abnormal ECG Confirmed by WARD,  DO, KRISTEN [phone]([id number]) on 09/23/2013 9:50:35 PM        Layla MawKristen N Ward, DO 09/24/13 0121

## 2013-09-23 NOTE — ED Notes (Signed)
Pt taken to US

## 2013-09-24 MED ORDER — ONDANSETRON HCL 4 MG PO TABS: 4.0000 mg | ORAL_TABLET | Freq: Four times a day (QID) | ORAL | Status: DC

## 2013-09-24 MED ORDER — OMEPRAZOLE 20 MG PO CPDR: 20.0000 mg | DELAYED_RELEASE_CAPSULE | Freq: Every day | ORAL | Status: DC

## 2013-09-24 NOTE — Discharge Instructions (Signed)
Chest Pain (Nonspecific) °It is often hard to give a specific diagnosis for the cause of chest pain. There is always a chance that your pain could be related to something serious, such as a heart attack or a blood clot in the lungs. You need to follow up with your caregiver for further evaluation. °CAUSES  °· Heartburn. °· Pneumonia or bronchitis. °· Anxiety or stress. °· Inflammation around your heart (pericarditis) or lung (pleuritis or pleurisy). °· A blood clot in the lung. °· A collapsed lung (pneumothorax). It can develop suddenly on its own (spontaneous pneumothorax) or from injury (trauma) to the chest. °· Shingles infection (herpes zoster virus). °The chest wall is composed of bones, muscles, and cartilage. Any of these can be the source of the pain. °· The bones can be bruised by injury. °· The muscles or cartilage can be strained by coughing or overwork. °· The cartilage can be affected by inflammation and become sore (costochondritis). °DIAGNOSIS  °Lab tests or other studies, such as X-rays, electrocardiography, stress testing, or cardiac imaging, may be needed to find the cause of your pain.  °TREATMENT  °· Treatment depends on what may be causing your chest pain. Treatment may include: °· Acid blockers for heartburn. °· Anti-inflammatory medicine. °· Pain medicine for inflammatory conditions. °· Antibiotics if an infection is present. °· You may be advised to change lifestyle habits. This includes stopping smoking and avoiding alcohol, caffeine, and chocolate. °· You may be advised to keep your head raised (elevated) when sleeping. This reduces the chance of acid going backward from your stomach into your esophagus. °· Most of the time, nonspecific chest pain will improve within 2 to 3 days with rest and mild pain medicine. °HOME CARE INSTRUCTIONS  °· If antibiotics were prescribed, take your antibiotics as directed. Finish them even if you start to feel better. °· For the next few days, avoid physical  activities that bring on chest pain. Continue physical activities as directed. °· Do not smoke. °· Avoid drinking alcohol. °· Only take over-the-counter or prescription medicine for pain, discomfort, or fever as directed by your caregiver. °· Follow your caregiver's suggestions for further testing if your chest pain does not go away. °· Keep any follow-up appointments you made. If you do not go to an appointment, you could develop lasting (chronic) problems with pain. If there is any problem keeping an appointment, you must call to reschedule. °SEEK MEDICAL CARE IF:  °· You think you are having problems from the medicine you are taking. Read your medicine instructions carefully. °· Your chest pain does not go away, even after treatment. °· You develop a rash with blisters on your chest. °SEEK IMMEDIATE MEDICAL CARE IF:  °· You have increased chest pain or pain that spreads to your arm, neck, jaw, back, or abdomen. °· You develop shortness of breath, an increasing cough, or you are coughing up blood. °· You have severe back or abdominal pain, feel nauseous, or vomit. °· You develop severe weakness, fainting, or chills. °· You have a fever. °THIS IS AN EMERGENCY. Do not wait to see if the pain will go away. Get medical help at once. Call your local emergency services (911 in [place] Do not drive yourself to the hospital. °MAKE SURE YOU:  °· Understand these instructions. °· Will watch your condition. °· Will get help right away if you are not doing well or get worse. °Document Released: 03/18/2005 Document Revised: 08/31/2011 Document Reviewed: 01/12/2008 °ExitCare® Patient Information ©2014 Exit[employer],   LLC. Gastroesophageal Reflux Disease, Adult Gastroesophageal reflux disease (GERD) happens when acid from your stomach flows up into the esophagus. When acid comes in contact with the esophagus, the acid causes soreness (inflammation) in the esophagus. Over time, GERD may create small holes (ulcers) in the lining of the  esophagus. CAUSES   Increased body weight. This puts pressure on the stomach, making acid rise from the stomach into the esophagus.  Smoking. This increases acid production in the stomach.  Drinking alcohol. This causes decreased pressure in the lower esophageal sphincter (valve or ring of muscle between the esophagus and stomach), allowing acid from the stomach into the esophagus.  Late evening meals and a full stomach. This increases pressure and acid production in the stomach.  A malformed lower esophageal sphincter. Sometimes, no cause is found. SYMPTOMS   Burning pain in the lower part of the mid-chest behind the breastbone and in the mid-stomach area. This may occur twice a week or more often.  Trouble swallowing.  Sore throat.  Dry cough.  Asthma-like symptoms including chest tightness, shortness of breath, or wheezing. DIAGNOSIS  Your caregiver may be able to diagnose GERD based on your symptoms. In some cases, X-rays and other tests may be done to check for complications or to check the condition of your stomach and esophagus. TREATMENT  Your caregiver may recommend over-the-counter or prescription medicines to help decrease acid production. Ask your caregiver before starting or adding any new medicines.  HOME CARE INSTRUCTIONS   Change the factors that you can control. Ask your caregiver for guidance concerning weight loss, quitting smoking, and alcohol consumption.  Avoid foods and drinks that make your symptoms worse, such as:  Caffeine or alcoholic drinks.  Chocolate.  Peppermint or mint flavorings.  Garlic and onions.  Spicy foods.  Citrus fruits, such as oranges, lemons, or limes.  Tomato-based foods such as sauce, chili, salsa, and pizza.  Fried and fatty foods.  Avoid lying down for the 3 hours prior to your bedtime or prior to taking a nap.  Eat small, frequent meals instead of large meals.  Wear loose-fitting clothing. Do not wear anything  tight around your waist that causes pressure on your stomach.  Raise the head of your bed 6 to 8 inches with wood blocks to help you sleep. Extra pillows will not help.  Only take over-the-counter or prescription medicines for pain, discomfort, or fever as directed by your caregiver.  Do not take aspirin, ibuprofen, or other nonsteroidal anti-inflammatory drugs (NSAIDs). SEEK IMMEDIATE MEDICAL CARE IF:   You have pain in your arms, neck, jaw, teeth, or back.  Your pain increases or changes in intensity or duration.  You develop nausea, vomiting, or sweating (diaphoresis).  You develop shortness of breath, or you faint.  Your vomit is green, yellow, black, or looks like coffee grounds or blood.  Your stool is red, bloody, or black. These symptoms could be signs of other problems, such as heart disease, gastric bleeding, or esophageal bleeding. MAKE SURE YOU:   Understand these instructions.  Will watch your condition.  Will get help right away if you are not doing well or get worse. Document Released: 03/18/2005 Document Revised: 08/31/2011 Document Reviewed: 12/26/2010 [facility] Patient Information 2014 [place], [place]. Diet for Gastroesophageal Reflux Disease, Adult Reflux (acid reflux) is when acid from your stomach flows up into the esophagus. When acid comes in contact with the esophagus, the acid causes irritation and soreness (inflammation) in the esophagus. When reflux happens often or  so severely that it causes damage to the esophagus, it is called gastroesophageal reflux disease (GERD). Nutrition therapy can help ease the discomfort of GERD. FOODS OR DRINKS TO AVOID OR LIMIT  Smoking or chewing tobacco. Nicotine is one of the most potent stimulants to acid production in the gastrointestinal tract.  Caffeinated and decaffeinated coffee and black tea.  Regular or low-calorie carbonated beverages or energy drinks (caffeine-free carbonated beverages are allowed).   Strong  spices, such as black pepper, white pepper, red pepper, cayenne, curry powder, and chili powder.  Peppermint or spearmint.  Chocolate.  High-fat foods, including meats and fried foods. Extra added fats including oils, butter, salad dressings, and nuts. Limit these to less than 8 tsp per day.  Fruits and vegetables if they are not tolerated, such as citrus fruits or tomatoes.  Alcohol.  Any food that seems to aggravate your condition. If you have questions regarding your diet, call your caregiver or a registered dietitian. OTHER THINGS THAT MAY HELP GERD INCLUDE:   Eating your meals slowly, in a relaxed setting.  Eating 5 to 6 small meals per day instead of 3 large meals.  Eliminating food for a period of time if it causes distress.  Not lying down until 3 hours after eating a meal.  Keeping the head of your bed raised 6 to 9 inches (15 to 23 cm) by using a foam wedge or blocks under the legs of the bed. Lying flat may make symptoms worse.  Being physically active. Weight loss may be helpful in reducing reflux in overweight or obese adults.  Wear loose fitting clothing EXAMPLE MEAL PLAN This meal plan is approximately 2,000 calories based on https://www.bernard.org/ChooseMyPlate.gov meal planning guidelines. Breakfast   cup cooked oatmeal.  1 cup strawberries.  1 cup low-fat milk.  1 oz almonds. Snack  1 cup cucumber slices.  6 oz yogurt (made from low-fat or fat-free milk). Lunch  2 slice whole-wheat bread.  2 oz sliced [place].  2 tsp mayonnaise.  1 cup blueberries.  1 cup snap peas. Snack  6 whole-wheat crackers.  1 oz string cheese. Dinner   cup brown rice.  1 cup mixed veggies.  1 tsp olive oil.  3 oz grilled fish. Document Released: 06/08/2005 Document Revised: 08/31/2011 Document Reviewed: 04/24/2011 [facility] Patient Information 2014 [place], [place]. Gastritis, Adult Gastritis is soreness and swelling (inflammation) of the lining of the stomach. Gastritis can  develop as a sudden onset (acute) or long-term (chronic) condition. If gastritis is not treated, it can lead to stomach bleeding and ulcers. CAUSES  Gastritis occurs when the stomach lining is weak or damaged. Digestive juices from the stomach then inflame the weakened stomach lining. The stomach lining may be weak or damaged due to viral or bacterial infections. One common bacterial infection is the Helicobacter pylori infection. Gastritis can also result from excessive alcohol consumption, taking certain medicines, or having too much acid in the stomach.  SYMPTOMS  In some cases, there are no symptoms. When symptoms are present, they may include:  Pain or a burning sensation in the upper abdomen.  Nausea.  Vomiting.  An uncomfortable feeling of fullness after eating. DIAGNOSIS  Your caregiver may suspect you have gastritis based on your symptoms and a physical exam. To determine the cause of your gastritis, your caregiver may perform the following:  Blood or stool tests to check for the H pylori bacterium.  Gastroscopy. A thin, flexible tube (endoscope) is passed down the esophagus and into the stomach.  The endoscope has a light and camera on the end. Your caregiver uses the endoscope to view the inside of the stomach.  Taking a tissue sample (biopsy) from the stomach to examine under a microscope. TREATMENT  Depending on the cause of your gastritis, medicines may be prescribed. If you have a bacterial infection, such as an H pylori infection, antibiotics may be given. If your gastritis is caused by too much acid in the stomach, H2 blockers or antacids may be given. Your caregiver may recommend that you stop taking aspirin, ibuprofen, or other nonsteroidal anti-inflammatory drugs (NSAIDs). HOME CARE INSTRUCTIONS  Only take over-the-counter or prescription medicines as directed by your caregiver.  If you were given antibiotic medicines, take them as directed. Finish them even if you start  to feel better.  Drink enough fluids to keep your urine clear or pale yellow.  Avoid foods and drinks that make your symptoms worse, such as:  Caffeine or alcoholic drinks.  Chocolate.  Peppermint or mint flavorings.  Garlic and onions.  Spicy foods.  Citrus fruits, such as oranges, lemons, or limes.  Tomato-based foods such as sauce, chili, salsa, and pizza.  Fried and fatty foods.  Eat small, frequent meals instead of large meals. SEEK IMMEDIATE MEDICAL CARE IF:   You have black or dark red stools.  You vomit blood or material that looks like coffee grounds.  You are unable to keep fluids down.  Your abdominal pain gets worse.  You have a fever.  You do not feel better after 1 week.  You have any other questions or concerns. MAKE SURE YOU:  Understand these instructions.  Will watch your condition.  Will get help right away if you are not doing well or get worse. Document Released: 06/02/2001 Document Revised: 12/08/2011 Document Reviewed: 07/22/2011 [facility] Patient Information 2014 [place], [place].

## 2013-09-24 NOTE — ED Notes (Signed)
Pt back from US

## 2013-11-20 ENCOUNTER — Encounter: Payer: Self-pay | Admitting: Gynecology

## 2013-11-20 ENCOUNTER — Ambulatory Visit (INDEPENDENT_AMBULATORY_CARE_PROVIDER_SITE_OTHER): Payer: Managed Care, Other (non HMO) | Admitting: Gynecology

## 2013-11-20 VITALS — BP 124/86

## 2013-11-20 DIAGNOSIS — N390 Urinary tract infection, site not specified: Secondary | ICD-10-CM

## 2013-11-20 DIAGNOSIS — R319 Hematuria, unspecified: Secondary | ICD-10-CM

## 2013-11-20 LAB — URINALYSIS W MICROSCOPIC + REFLEX CULTURE
BILIRUBIN URINE: NEGATIVE
Glucose, UA: NEGATIVE mg/dL
Ketones, ur: NEGATIVE mg/dL
NITRITE: POSITIVE — AB
PH: 7 (ref 5.0–8.0)
Protein, ur: 100 mg/dL — AB
SPECIFIC GRAVITY, URINE: 1.025 (ref 1.005–1.030)
Urobilinogen, UA: 0.2 mg/dL (ref 0.0–1.0)

## 2013-11-20 MED ORDER — NITROFURANTOIN MONOHYD MACRO 100 MG PO CAPS: 100.0000 mg | ORAL_CAPSULE | Freq: Two times a day (BID) | ORAL | Status: AC

## 2013-11-20 MED ORDER — PHENAZOPYRIDINE HCL 200 MG PO TABS: 200.0000 mg | ORAL_TABLET | Freq: Three times a day (TID) | ORAL | Status: DC | PRN

## 2013-11-20 NOTE — Addendum Note (Signed)
Addended by: Berna Spare A on: 11/20/2013 02:38 PM   Modules accepted: Orders

## 2013-11-20 NOTE — Patient Instructions (Signed)
Urinary Tract Infection  Urinary tract infections (UTIs) can develop anywhere along your urinary tract. Your urinary tract is your body's drainage system for removing wastes and extra water. Your urinary tract includes two kidneys, two ureters, a bladder, and a urethra. Your kidneys are a pair of bean-shaped organs. Each kidney is about the size of your fist. They are located below your ribs, one on each side of your spine.  CAUSES  Infections are caused by microbes, which are microscopic organisms, including fungi, viruses, and bacteria. These organisms are so small that they can only be seen through a microscope. Bacteria are the microbes that most commonly cause UTIs.  SYMPTOMS   Symptoms of UTIs may vary by age and gender of the patient and by the location of the infection. Symptoms in young women typically include a frequent and intense urge to urinate and a painful, burning feeling in the bladder or urethra during urination. Older women and men are more likely to be tired, shaky, and weak and have muscle aches and abdominal pain. A fever may mean the infection is in your kidneys. Other symptoms of a kidney infection include pain in your back or sides below the ribs, nausea, and vomiting.  DIAGNOSIS  To diagnose a UTI, your caregiver will ask you about your symptoms. Your caregiver also will ask to provide a urine sample. The urine sample will be tested for bacteria and white blood cells. White blood cells are made by your body to help fight infection.  TREATMENT   Typically, UTIs can be treated with medication. Because most UTIs are caused by a bacterial infection, they usually can be treated with the use of antibiotics. The choice of antibiotic and length of treatment depend on your symptoms and the type of bacteria causing your infection.  HOME CARE INSTRUCTIONS   If you were prescribed antibiotics, take them exactly as your caregiver instructs you. Finish the medication even if you feel better after you  have only taken some of the medication.   Drink enough water and fluids to keep your urine clear or pale yellow.   Avoid caffeine, tea, and carbonated beverages. They tend to irritate your bladder.   Empty your bladder often. Avoid holding urine for long periods of time.   Empty your bladder before and after sexual intercourse.   After a bowel movement, women should cleanse from front to back. Use each tissue only once.  SEEK MEDICAL CARE IF:    You have back pain.   You develop a fever.   Your symptoms do not begin to resolve within 3 days.  SEEK IMMEDIATE MEDICAL CARE IF:    You have severe back pain or lower abdominal pain.   You develop chills.   You have nausea or vomiting.   You have continued burning or discomfort with urination.  MAKE SURE YOU:    Understand these instructions.   Will watch your condition.   Will get help right away if you are not doing well or get worse.  Document Released: 03/18/2005 Document Revised: 12/08/2011 Document Reviewed: 07/17/2011  [employer] Patient Information 2014 [employer].

## 2013-11-20 NOTE — Progress Notes (Signed)
   Patient presented to the office today stating he should notice some vaginal bleeding last week x1 time and then yesterday when she said in the toilet. She was feeling some suprapubic pressure and urinating small spurts. Patient denied any back pain, nausea, vomiting, or fever or chills. 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 She has done well from her surgery otherwise.  Exam: Back: No CVA tenderness Abdomen: Soft nontender no rebound or guarding Pelvic: Bartholin urethra Skene was within normal limits Vagina: No lesions or discharge vaginal cuff intact area of suburethral sling with no evidence of erosion. Rectal exam: Not examined  Urinalysis: Large amount of blood in her urine positive for nitrites and small amount of leukocytes  Assessment/plan: Patient will be treated for suspected urinary tract infection (cystitis) with Macrobid one by mouth twice a day for 7 days. She will be prescribed Pyridium 200 mg to take 1 by mouth 3 times a day for the next 2-3 days. If her symptoms do not improve after the above treatment she'll return back to the office for further evaluation.

## 2013-11-22 LAB — URINE CULTURE: Colony Count: >=100000

## 2014-01-16 ENCOUNTER — Encounter: Payer: Self-pay | Admitting: Gynecology

## 2014-01-16 ENCOUNTER — Ambulatory Visit (INDEPENDENT_AMBULATORY_CARE_PROVIDER_SITE_OTHER): Payer: Managed Care, Other (non HMO) | Admitting: Gynecology

## 2014-01-16 VITALS — BP 124/80 | Ht 61.0 in | Wt 137.0 lb

## 2014-01-16 DIAGNOSIS — Z01419 Encounter for gynecological examination (general) (routine) without abnormal findings: Secondary | ICD-10-CM

## 2014-01-16 NOTE — Patient Instructions (Signed)
Remember fasting blood work  Remember to schedule mammogram

## 2014-01-16 NOTE — Progress Notes (Signed)
Carloyn MannerChristina P Elliott 09/02/1970 [id number]   History:    43 y.o.  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 She has done well from her surgery otherwise. Patient denies any prior history of abnormal Pap smear. She had a normal baseline mammogram in 2007. Patient with strong family history of diabetes.   Past medical history,surgical history, family history and social history were all reviewed and documented in the EPIC chart.  Gynecologic History No LMP recorded. Patient has had a hysterectomy. Contraception: status post hysterectomy Last Pap: 2012. Results were: normal Last mammogram: Baseline age 43. Results were: normal  Obstetric History OB History  Gravida Para Term Preterm AB SAB TAB Ectopic Multiple Living  3 1  1 2 2    1     # Outcome Date GA Lbr Len/2nd Weight Sex Delivery Anes PTL Lv  3 SAB           2 SAB           1 PRE     M SVD  Y Y       ROS: A ROS was performed and pertinent positives and negatives are included in the history.  GENERAL: No fevers or chills. HEENT: No change in vision, no earache, sore throat or sinus congestion. NECK: No pain or stiffness. CARDIOVASCULAR: No chest pain or pressure. No palpitations. PULMONARY: No shortness of breath, cough or wheeze. GASTROINTESTINAL: No abdominal pain, nausea, vomiting or diarrhea, melena or bright red blood per rectum. GENITOURINARY: No urinary frequency, urgency, hesitancy or dysuria. MUSCULOSKELETAL: No joint or muscle pain, no back pain, no recent trauma. DERMATOLOGIC: No rash, no itching, no lesions. ENDOCRINE: No polyuria, polydipsia, no heat or cold intolerance. No recent change in weight. HEMATOLOGICAL: No anemia or easy bruising or bleeding. NEUROLOGIC: No headache, seizures, numbness, tingling  or weakness. PSYCHIATRIC: No depression, no loss of interest in normal activity or change in sleep pattern.     Exam: chaperone present  BP 124/80  Ht 5\' 1"  (1.549 m)  Wt 137 lb (62.143 kg)  BMI 25.90 kg/m2  Body mass index is 25.9 kg/(m^2).  General appearance : Well developed well nourished female. No acute distress HEENT: Neck supple, trachea midline, no carotid bruits, no thyroidmegaly Lungs: Clear to auscultation, no rhonchi or wheezes, or rib retractions  Heart: Regular rate and rhythm, no murmurs or gallops Breast:Examined in sitting and supine position were symmetrical in appearance, no palpable masses or tenderness,  no skin retraction, no nipple inversion, no nipple discharge, no skin discoloration, no axillary or supraclavicular lymphadenopathy Abdomen: no palpable masses or tenderness, no rebound or guarding Extremities: no edema or skin discoloration or tenderness  Pelvic:  Bartholin, Urethra, Skene Glands: Within normal limits             Vagina: No gross lesions or discharge  Cervix: Absent  Uterus  Absent  Adnexa  Without masses or tenderness  Anus and perineum  normal   Rectovaginal  normal sphincter tone without palpated masses or tenderness             Hemoccult not indicated     Assessment/Plan:  43 y.o. female for annual exam with no abnormalities detected. Patient will return back to the office later this week and a fasting state for the following labs: Fasting lipid profile,  complete metabolic panel, CBC, TSH and urinalysis. Pap smear was not done today in accordance to the new guidelines. Patient was reminded him for his monthly self breast examination. A requisition for her to schedule her mammogram was provided as well.  Note: This dictation was prepared with  Dragon/digital dictation along withSmart phrase technology. Any transcriptional errors that result from this process are unintentional.   Ok Edwards MD, 5:12 PM 01/16/2014

## 2014-04-23 ENCOUNTER — Encounter: Payer: Self-pay | Admitting: Gynecology

## 2014-08-14 ENCOUNTER — Telehealth: Payer: Self-pay | Admitting: *Deleted

## 2014-08-14 NOTE — Telephone Encounter (Signed)
Pt called c/o urgency and frequency that started last night, requesting a Rx. I explained to patient that best to have OV with symptoms to rule out UTI. Pt said she is going to try OTC Azo and if no better she will call to make OV.

## 2015-01-18 ENCOUNTER — Encounter: Payer: Managed Care, Other (non HMO) | Admitting: Gynecology

## 2015-03-26 ENCOUNTER — Encounter: Payer: Managed Care, Other (non HMO) | Admitting: Gynecology

## 2015-04-05 ENCOUNTER — Ambulatory Visit (HOSPITAL_COMMUNITY)
Admission: RE | Admit: 2015-04-05 | Discharge: 2015-04-05 | Disposition: A | Discharge status: Home / Self Care | Payer: Managed Care, Other (non HMO) | Source: Ambulatory Visit | Attending: Gynecology | Admitting: Gynecology

## 2015-04-05 ENCOUNTER — Ambulatory Visit (INDEPENDENT_AMBULATORY_CARE_PROVIDER_SITE_OTHER): Payer: Managed Care, Other (non HMO) | Admitting: Gynecology

## 2015-04-05 ENCOUNTER — Encounter: Payer: Self-pay | Admitting: Gynecology

## 2015-04-05 ENCOUNTER — Other Ambulatory Visit: Payer: Self-pay

## 2015-04-05 VITALS — BP 114/78 | Ht 60.25 in | Wt 128.0 lb

## 2015-04-05 DIAGNOSIS — M6289 Other specified disorders of muscle: Secondary | ICD-10-CM | POA: Diagnosis not present

## 2015-04-05 DIAGNOSIS — R059 Cough, unspecified: Secondary | ICD-10-CM

## 2015-04-05 DIAGNOSIS — Z01419 Encounter for gynecological examination (general) (routine) without abnormal findings: Secondary | ICD-10-CM

## 2015-04-05 DIAGNOSIS — R05 Cough: Secondary | ICD-10-CM

## 2015-04-05 DIAGNOSIS — Z1231 Encounter for screening mammogram for malignant neoplasm of breast: Secondary | ICD-10-CM

## 2015-04-05 NOTE — Progress Notes (Signed)
[name] 28-Oct-1970 [id number]   History:    44 y.o.  for annual gyn exam who states that for several weeks she has had a nonproductive cough. Patient also feeling somewhat tired and fatigued. 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 Both parents had lung cancer from smoking. She is concerned about secondhand smoke. 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 She has done well from her surgery otherwise. Patient denies any prior history of abnormal Pap smear. She had a normal baseline mammogram in 2007. Patient with strong family history of diabetes.  Past medical history,surgical history, family history and social history were all reviewed and documented in the EPIC chart.  Gynecologic History No LMP recorded. Patient has had a hysterectomy. Contraception: status post hysterectomy Last Pap: 2014. Results were: normal Last mammogram: 2007. Results were: normal  Obstetric History OB History  Gravida Para Term Preterm AB SAB TAB Ectopic Multiple Living  # Outcome Date GA Lbr Len/2nd Weight Sex Delivery Anes PTL Lv  3 SAB           2 SAB           1 Preterm     M Vag-Spont  Y Y       ROS: A ROS was performed and pertinent positives and negatives are included in the history.  GENERAL: No fevers or chills. HEENT: No change in vision, no earache, sore throat or sinus congestion. NECK: No pain or stiffness. CARDIOVASCULAR: No chest pain or pressure. No palpitations. PULMONARY: No shortness of breath, cough or wheeze. GASTROINTESTINAL: No abdominal pain, nausea, vomiting or diarrhea, melena or bright red blood per rectum. GENITOURINARY: No urinary frequency, urgency, hesitancy or dysuria. MUSCULOSKELETAL: No joint  or muscle pain, no back pain, no recent trauma. DERMATOLOGIC: No rash, no itching, no lesions. ENDOCRINE: No polyuria, polydipsia, no heat or cold intolerance. No recent change in weight. HEMATOLOGICAL: No anemia or easy bruising or bleeding. NEUROLOGIC: No headache, seizures, numbness, tingling or weakness. PSYCHIATRIC: No depression, no loss of interest in normal activity or change in sleep pattern.     Exam: chaperone present  BP 114/78 mmHg  Ht 5' 0.25" (1.53 m)  Wt 128 lb (58.06 kg)  BMI 24.80 kg/m2  Body mass index is 24.8 kg/(m^2).  General appearance : Well developed well nourished female. No acute distress HEENT: Eyes: no retinal hemorrhage or exudates,  Neck supple, trachea midline, no carotid bruits, no thyroidmegaly Lungs: Clear to auscultation, no rhonchi or wheezes, or rib retractions  Heart: Regular rate and rhythm, no murmurs or gallops Breast:Examined in sitting and supine position were symmetrical in appearance, no palpable masses or tenderness,  no skin retraction, no nipple inversion, no nipple discharge, no skin discoloration, no axillary or supraclavicular lymphadenopathy Abdomen: no palpable masses or tenderness, no rebound or guarding Extremities: no edema or skin discoloration or tenderness  Pelvic:  Bartholin, Urethra, Skene Glands: Within normal limits             Vagina: No gross lesions or discharge  Cervix: Absent  Uterus  absent  Adnexa  Without masses or tenderness  Anus and perineum  normal   Rectovaginal  normal sphincter  tone without palpated masses or tenderness             Hemoccult not indicated     Assessment/Plan:  44 y.o. female for annual exam with a risk factor for lung cancer based on secondary hand smoke since both parents had lung cancer from smoking. Patient will have a chest x-ray PA and lateral ordered today also to rule out TB a Quantiferon tb gold assay blood tests will be drawn today. Next week in a fasting state she'll have the  following blood work ordered: Comprehensive metabolic panel, fasting lipid profile, TSH, CBC, as well as a vitamin D level. Pap smear no longer indicated according to the new guidelines. Requisition provided for patient schedule mammogram.   Ok EdwardsFERNANDEZ,Malikai Gut H MD, 3:28 PM 04/05/2015

## 2015-04-06 LAB — URINALYSIS W MICROSCOPIC + REFLEX CULTURE
BACTERIA UA: NONE SEEN [HPF]
Bilirubin Urine: NEGATIVE
CRYSTALS: NONE SEEN [HPF]
Casts: NONE SEEN [LPF]
Glucose, UA: NEGATIVE
KETONES UR: NEGATIVE
Nitrite: NEGATIVE
PROTEIN: NEGATIVE
RBC / HPF: NONE SEEN RBC/HPF (ref <=2)
Specific Gravity, Urine: 1.012 (ref 1.001–1.035)
Yeast: NONE SEEN [HPF]
pH: 6 (ref 5.0–8.0)

## 2015-04-07 LAB — URINE CULTURE
COLONY COUNT: NO GROWTH
ORGANISM ID, BACTERIA: NO GROWTH

## 2015-04-08 ENCOUNTER — Other Ambulatory Visit: Payer: Self-pay | Admitting: *Deleted

## 2015-04-08 DIAGNOSIS — R319 Hematuria, unspecified: Secondary | ICD-10-CM

## 2015-04-09 ENCOUNTER — Other Ambulatory Visit: Payer: Managed Care, Other (non HMO)

## 2015-04-09 DIAGNOSIS — R05 Cough: Secondary | ICD-10-CM

## 2015-04-09 DIAGNOSIS — M6289 Other specified disorders of muscle: Secondary | ICD-10-CM

## 2015-04-09 DIAGNOSIS — R059 Cough, unspecified: Secondary | ICD-10-CM

## 2015-04-09 DIAGNOSIS — Z01419 Encounter for gynecological examination (general) (routine) without abnormal findings: Secondary | ICD-10-CM

## 2015-04-10 LAB — LIPID PANEL
CHOLESTEROL: 205 mg/dL — AB (ref 125–200)
HDL: 51 mg/dL (ref >=46)
LDL Cholesterol: 124 mg/dL (ref <130)
Total CHOL/HDL Ratio: 4 Ratio (ref <=5.0)
Triglycerides: 148 mg/dL (ref <150)
VLDL: 30 mg/dL (ref <30)

## 2015-04-10 LAB — COMPREHENSIVE METABOLIC PANEL
ALK PHOS: 85 U/L (ref 33–115)
ALT: 8 U/L (ref 6–29)
AST: 19 U/L (ref 10–30)
Albumin: 4.5 g/dL (ref 3.6–5.1)
BILIRUBIN TOTAL: 0.6 mg/dL (ref 0.2–1.2)
BUN: 11 mg/dL (ref 7–25)
CO2: 27 mmol/L (ref 20–31)
CREATININE: 0.81 mg/dL (ref 0.50–1.10)
Calcium: 9.5 mg/dL (ref 8.6–10.2)
Chloride: 102 mmol/L (ref 98–110)
GLUCOSE: 75 mg/dL (ref 65–99)
Potassium: 4.4 mmol/L (ref 3.5–5.3)
SODIUM: 139 mmol/L (ref 135–146)
TOTAL PROTEIN: 7.2 g/dL (ref 6.1–8.1)

## 2015-04-10 LAB — CBC WITH DIFFERENTIAL/PLATELET
Basophils Absolute: 0.1 10*3/uL (ref 0.0–0.1)
Basophils Relative: 1 % (ref 0–1)
EOS ABS: 0.3 10*3/uL (ref 0.0–0.7)
EOS PCT: 5 % (ref 0–5)
HCT: 41.1 % (ref 36.0–46.0)
Hemoglobin: 14.2 g/dL (ref 12.0–15.0)
LYMPHS ABS: 2 10*3/uL (ref 0.7–4.0)
Lymphocytes Relative: 37 % (ref 12–46)
MCH: 30.6 pg (ref 26.0–34.0)
MCHC: 34.5 g/dL (ref 30.0–36.0)
MCV: 88.6 fL (ref 78.0–100.0)
MPV: 10.4 fL (ref 8.6–12.4)
Monocytes Absolute: 0.4 10*3/uL (ref 0.1–1.0)
Monocytes Relative: 7 % (ref 3–12)
Neutro Abs: 2.8 10*3/uL (ref 1.7–7.7)
Neutrophils Relative %: 50 % (ref 43–77)
PLATELETS: 273 10*3/uL (ref 150–400)
RBC: 4.64 MIL/uL (ref 3.87–5.11)
RDW: 14.6 % (ref 11.5–15.5)
WBC: 5.5 10*3/uL (ref 4.0–10.5)

## 2015-04-10 LAB — VITAMIN D 25 HYDROXY (VIT D DEFICIENCY, FRACTURES): VIT D 25 HYDROXY: 31 ng/mL (ref 30–100)

## 2015-04-10 LAB — TSH: TSH: 2.676 u[IU]/mL (ref 0.350–4.500)

## 2015-04-12 LAB — QUANTIFERON TB GOLD ASSAY (BLOOD)
INTERFERON GAMMA RELEASE ASSAY: NEGATIVE
MITOGEN VALUE: 9.24 [IU]/mL
QUANTIFERON NIL VALUE: 0.02 [IU]/mL
Quantiferon Tb Ag Minus Nil Value: 0 IU/mL
TB Ag value: 0.02 IU/mL

## 2015-04-19 ENCOUNTER — Ambulatory Visit
Admission: RE | Admit: 2015-04-19 | Discharge: 2015-04-19 | Disposition: A | Discharge status: Home / Self Care | Payer: 59 | Source: Ambulatory Visit

## 2015-04-19 DIAGNOSIS — Z1231 Encounter for screening mammogram for malignant neoplasm of breast: Secondary | ICD-10-CM

## 2015-09-27 ENCOUNTER — Telehealth: Payer: Self-pay | Admitting: *Deleted

## 2015-09-27 NOTE — Telephone Encounter (Signed)
Pt called left message in triage voicemail c/o stress requesting Rx for Xanax, I called and left message for pt to call me.

## 2015-09-30 NOTE — Telephone Encounter (Signed)
[name] Dr.Fernandez told me to send this request to you, pt is hoping to get this Rx today.

## 2015-09-30 NOTE — Telephone Encounter (Signed)
Message left

## 2015-09-30 NOTE — Telephone Encounter (Signed)
Pt called requesting Rx on Xanax 0.5 mg tablet, under a lot of stress at work, last time this was filled was in 2013.  Please advise

## 2015-10-01 MED ORDER — ALPRAZOLAM 0.5 MG PO TABS: 0.5000 mg | ORAL_TABLET | Freq: Every evening | ORAL | Status: AC | PRN

## 2015-10-01 NOTE — Telephone Encounter (Signed)
Patient may have prescription refill of Xanax 0.5 mg to take 1 by mouth daily when necessary anxiety. #30 with 1 refill. If [name] has not called in the prescription already

## 2015-10-01 NOTE — Telephone Encounter (Signed)
Left on voicemail Rx called in. 

## 2016-11-04 ENCOUNTER — Encounter: Payer: Self-pay | Admitting: Gynecology

## 2019-04-07 ENCOUNTER — Other Ambulatory Visit: Payer: Self-pay

## 2019-04-07 DIAGNOSIS — Z20822 Contact with and (suspected) exposure to covid-19: Secondary | ICD-10-CM

## 2019-04-09 LAB — NOVEL CORONAVIRUS, NAA: SARS-CoV-2, NAA: NOT DETECTED

## 2020-08-19 ENCOUNTER — Ambulatory Visit
Admission: RE | Admit: 2020-08-19 | Discharge: 2020-08-19 | Disposition: A | Discharge status: Home / Self Care | Payer: BLUE CROSS/BLUE SHIELD | Attending: Family Medicine | Admitting: Family Medicine

## 2020-08-19 ENCOUNTER — Other Ambulatory Visit: Payer: Self-pay | Admitting: Family Medicine

## 2020-08-19 ENCOUNTER — Ambulatory Visit
Admission: RE | Admit: 2020-08-19 | Discharge: 2020-08-19 | Disposition: A | Discharge status: Home / Self Care | Payer: BLUE CROSS/BLUE SHIELD | Source: Ambulatory Visit | Attending: Family Medicine | Admitting: Family Medicine

## 2020-08-19 ENCOUNTER — Inpatient Hospital Stay: Admission: RE | Admit: 2020-08-19 | Payer: 59 | Source: Home / Self Care | Admitting: *Deleted

## 2020-08-19 DIAGNOSIS — R0602 Shortness of breath: Secondary | ICD-10-CM | POA: Insufficient documentation

## 2020-08-19 DIAGNOSIS — Z801 Family history of malignant neoplasm of trachea, bronchus and lung: Secondary | ICD-10-CM | POA: Diagnosis present

## 2021-09-19 ENCOUNTER — Ambulatory Visit
Admission: RE | Admit: 2021-09-19 | Discharge: 2021-09-19 | Disposition: A | Discharge status: Home / Self Care | Payer: BLUE CROSS/BLUE SHIELD | Source: Ambulatory Visit | Attending: Family Medicine | Admitting: Family Medicine

## 2021-09-19 ENCOUNTER — Other Ambulatory Visit: Payer: Self-pay | Admitting: Family Medicine

## 2021-09-19 DIAGNOSIS — Z801 Family history of malignant neoplasm of trachea, bronchus and lung: Secondary | ICD-10-CM

## 2021-09-22 ENCOUNTER — Other Ambulatory Visit: Payer: Self-pay | Admitting: Family Medicine

## 2021-09-22 DIAGNOSIS — Z139 Encounter for screening, unspecified: Secondary | ICD-10-CM

## 2021-09-23 ENCOUNTER — Ambulatory Visit
Admission: RE | Admit: 2021-09-23 | Discharge: 2021-09-23 | Disposition: A | Discharge status: Home / Self Care | Payer: BLUE CROSS/BLUE SHIELD | Source: Ambulatory Visit | Attending: Family Medicine | Admitting: Family Medicine

## 2021-09-23 DIAGNOSIS — Z1231 Encounter for screening mammogram for malignant neoplasm of breast: Secondary | ICD-10-CM | POA: Insufficient documentation

## 2021-09-23 DIAGNOSIS — Z139 Encounter for screening, unspecified: Secondary | ICD-10-CM | POA: Diagnosis present

## 2023-12-27 IMAGING — MG MM DIGITAL SCREENING BILAT W/ TOMO AND CAD
8 series · 9 of 24 positions shown · non-contrast
Comparison: Previous exam(s).

CLINICAL DATA: Screening.

EXAM:
DIGITAL SCREENING BILATERAL MAMMOGRAM WITH TOMOSYNTHESIS AND CAD
TECHNIQUE: Bilateral screening digital craniocaudal and mediolateral oblique
mammograms were obtained. Bilateral screening digital breast
tomosynthesis was performed. The images were evaluated with
computer-aided detection.

[R CC synth-2D]
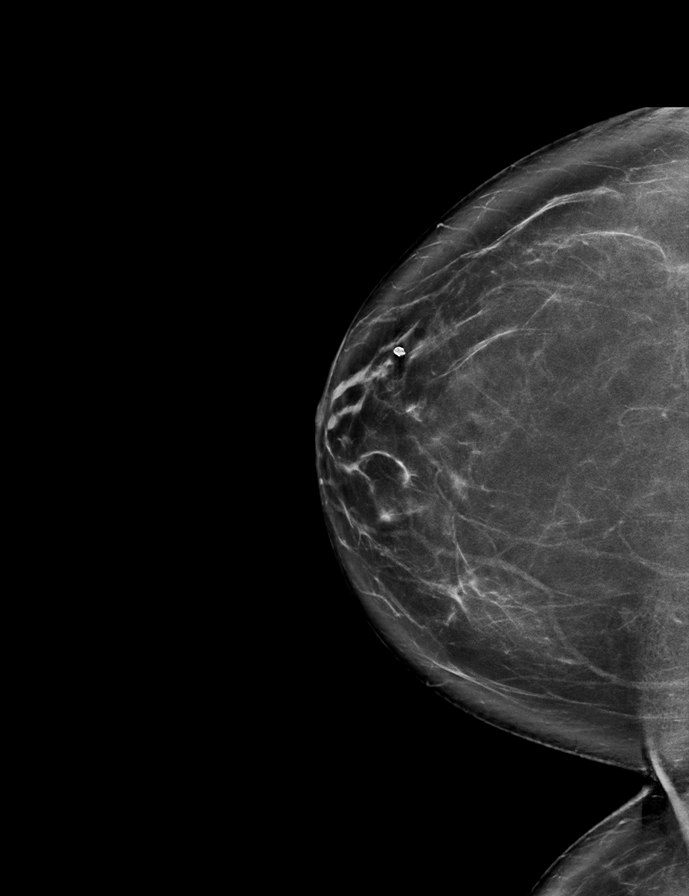

[R MLO synth-2D]
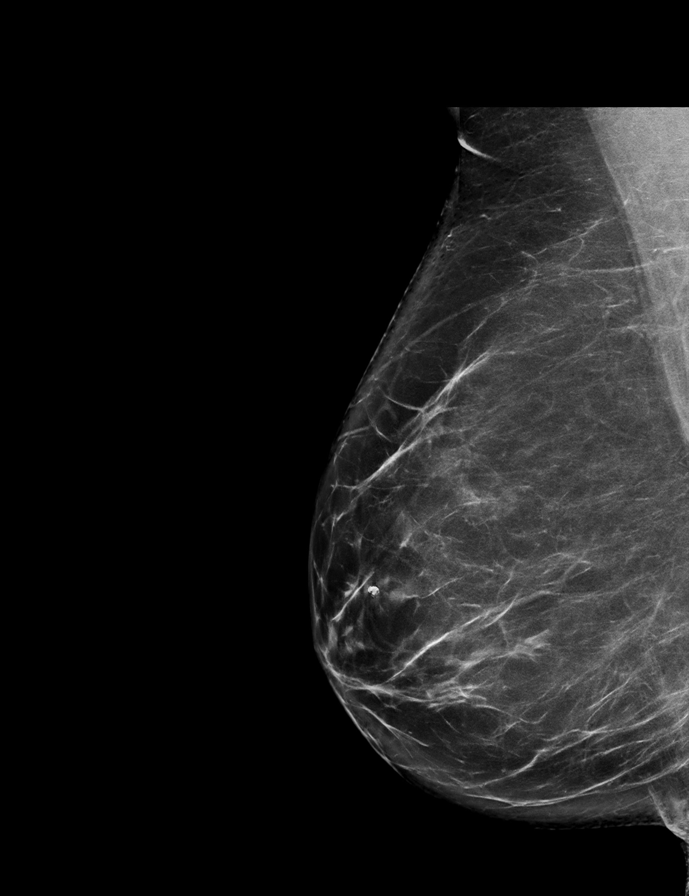

[L CC synth-2D]
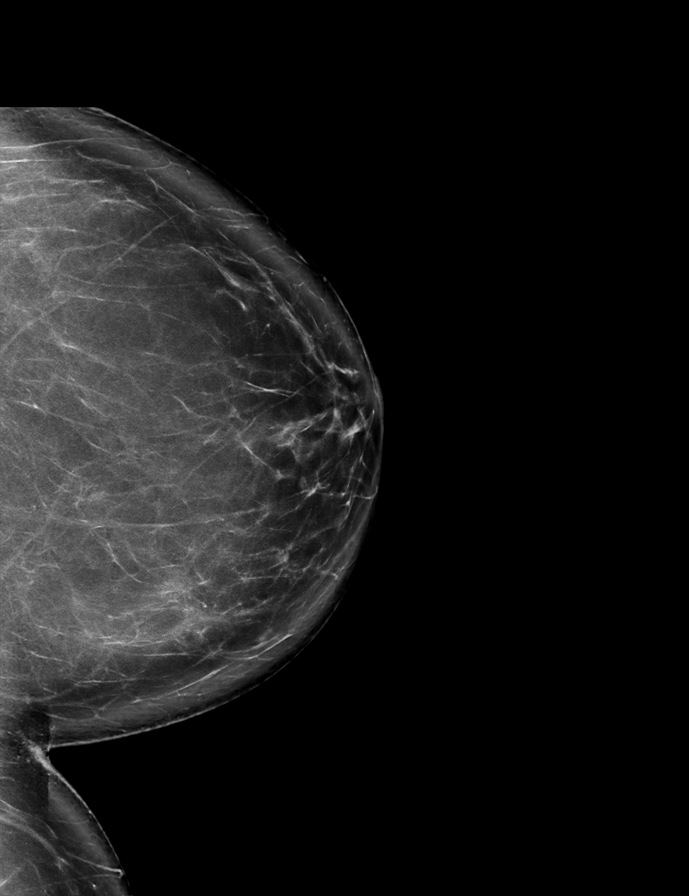

[L MLO synth-2D]
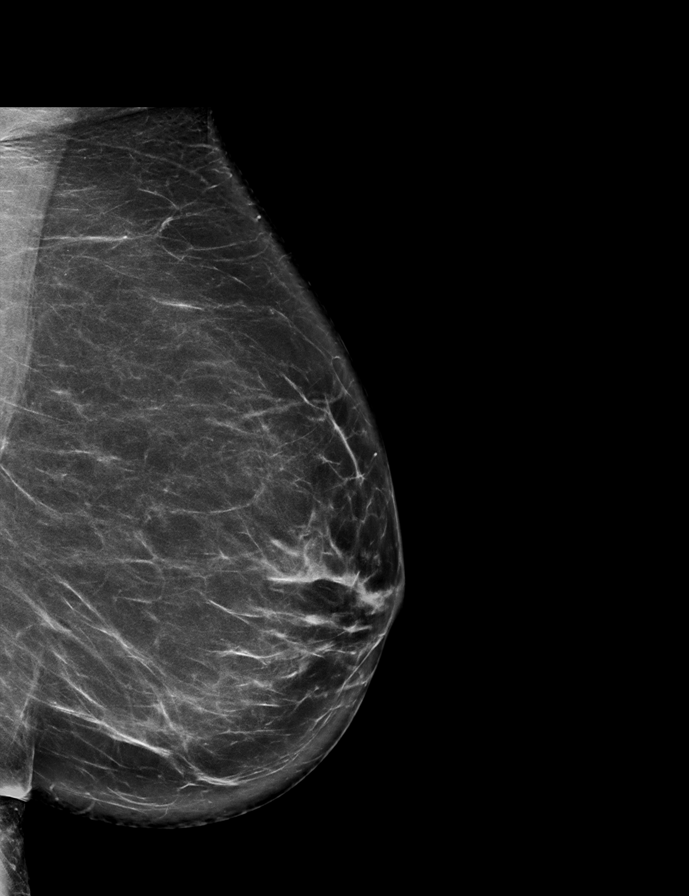

[R MLO tomo · 2 of 81 frames shown]
[frame 27/81]
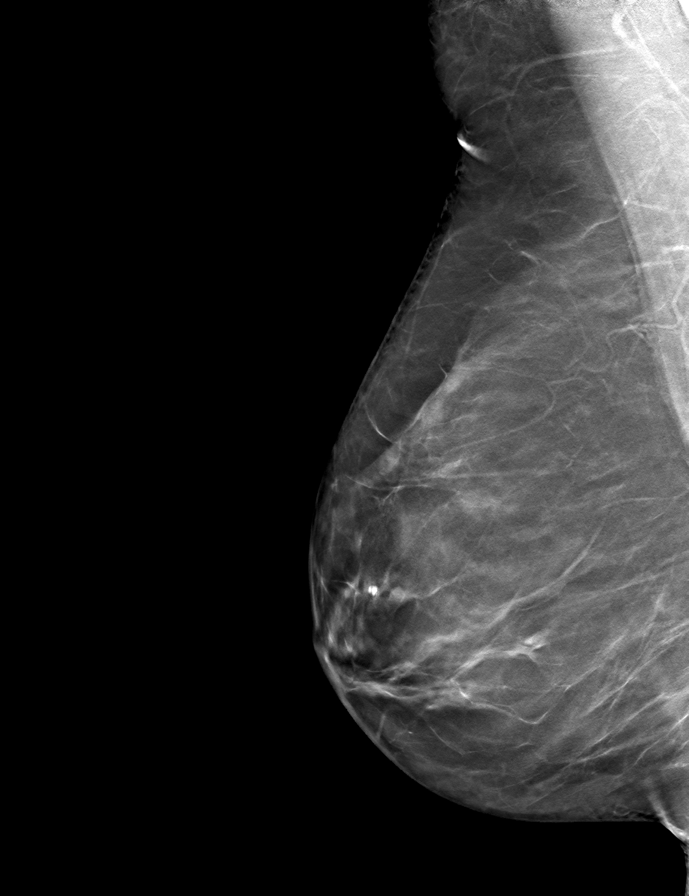
[frame 41/81]
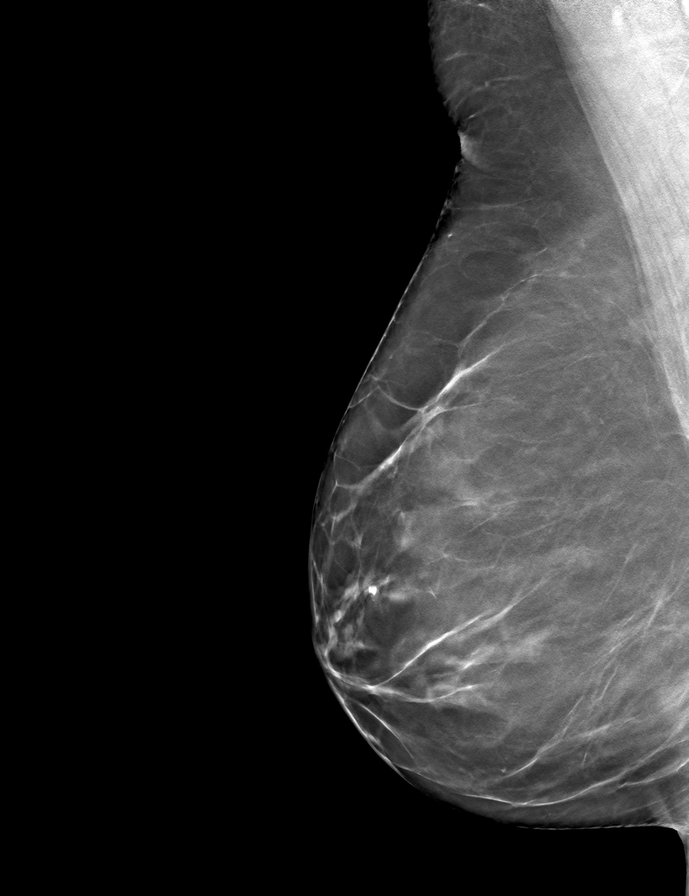

[R CC tomo · tomo slice 41/81.0]
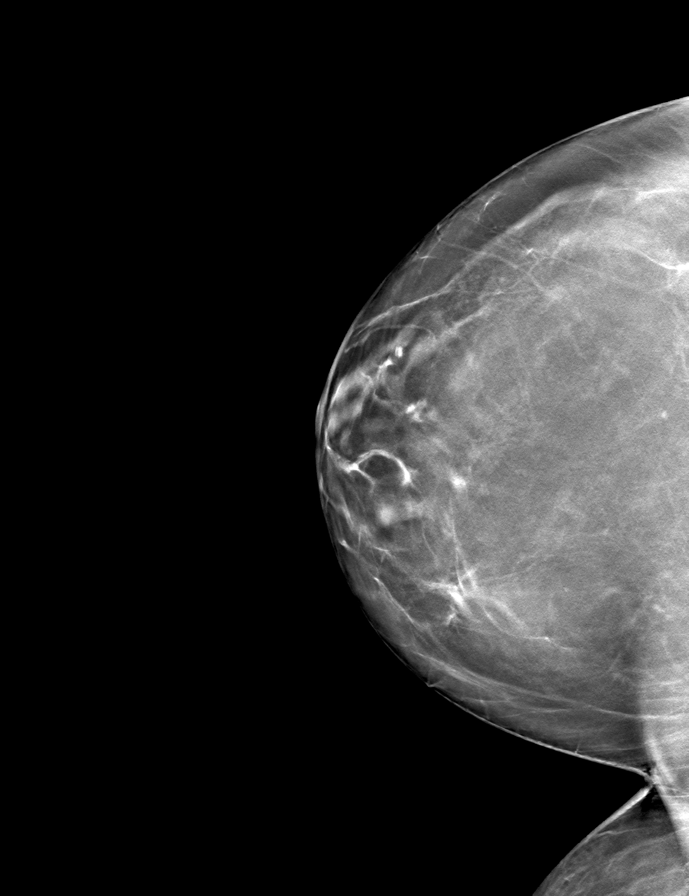

[L CC tomo · tomo slice 43/85.0]
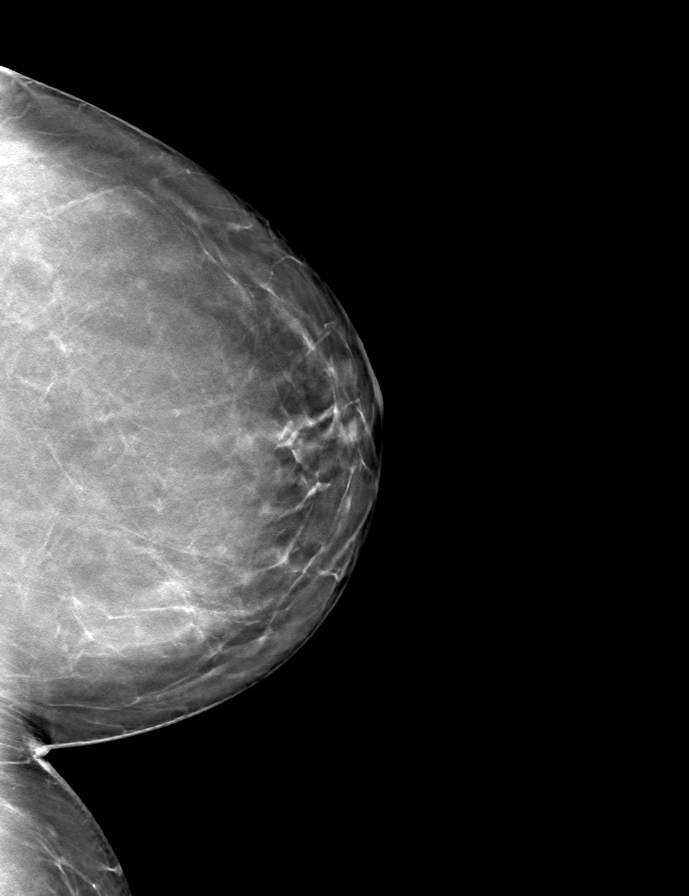

[L MLO tomo · tomo slice 40/79.0]
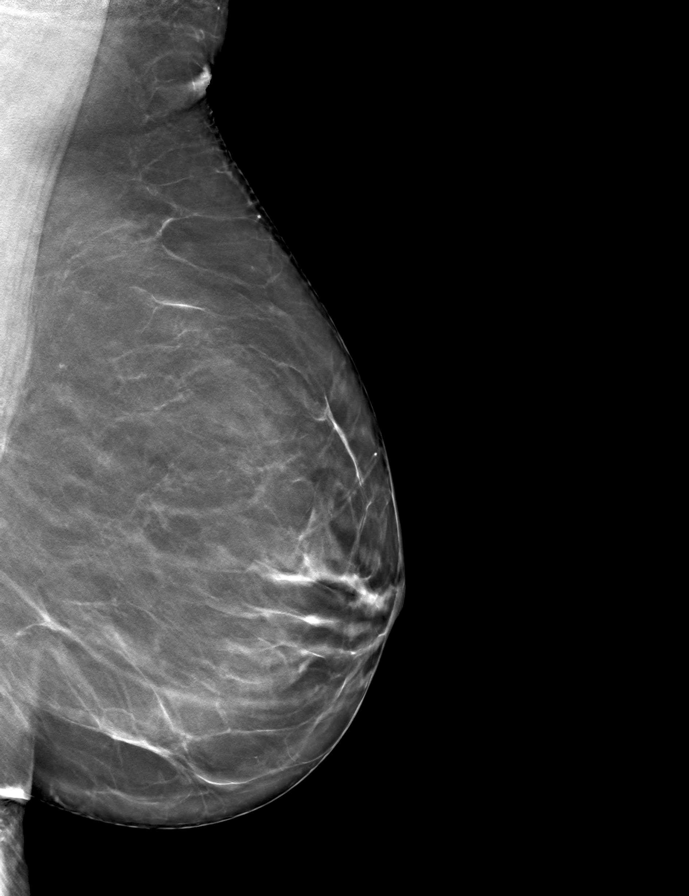

[9 of 24 positions shown; findings below may reference images not displayed]

ACR Breast Density Category b: There are scattered areas of
fibroglandular density.
FINDINGS: There are no findings suspicious for malignancy.
IMPRESSION: No mammographic evidence of malignancy. A result letter of this
screening mammogram will be mailed directly to the patient.

RECOMMENDATION:
Screening mammogram in one year. (Code:51-O-LD2)

BI-RADS CATEGORY  1: Negative.
